# Patient Record
Sex: Female | Born: 1995 | Race: White | Hispanic: No | Marital: Single | Smoking: Never smoker
Health system: Southern US, Community
[De-identification: ages and names within clinical notes are randomized; demographics above are authoritative.]

## PROBLEM LIST (undated history)

## (undated) ENCOUNTER — Emergency Department: Payer: BLUE CROSS/BLUE SHIELD | Source: Home / Self Care

## (undated) DIAGNOSIS — J45909 Unspecified asthma, uncomplicated: Secondary | ICD-10-CM

## (undated) DIAGNOSIS — F410 Panic disorder [episodic paroxysmal anxiety] without agoraphobia: Secondary | ICD-10-CM

## (undated) DIAGNOSIS — F209 Schizophrenia, unspecified: Secondary | ICD-10-CM

## (undated) DIAGNOSIS — F419 Anxiety disorder, unspecified: Secondary | ICD-10-CM

## (undated) DIAGNOSIS — F4312 Post-traumatic stress disorder, chronic: Secondary | ICD-10-CM

## (undated) DIAGNOSIS — F609 Personality disorder, unspecified: Secondary | ICD-10-CM

## (undated) DIAGNOSIS — F32A Depression, unspecified: Secondary | ICD-10-CM

## (undated) DIAGNOSIS — E669 Obesity, unspecified: Secondary | ICD-10-CM

---

## 2014-03-15 ENCOUNTER — Emergency Department: Payer: Self-pay | Admitting: Emergency Medicine

## 2014-03-15 LAB — SALICYLATE LEVEL: Salicylates, Serum: <1.7 mg/dL

## 2014-03-15 LAB — URINALYSIS, COMPLETE
BILIRUBIN, UR: NEGATIVE
Bacteria: NONE SEEN
Blood: NEGATIVE
Glucose,UR: NEGATIVE mg/dL (ref 0–75)
Ketone: NEGATIVE
NITRITE: NEGATIVE
Ph: 6 (ref 4.5–8.0)
Protein: NEGATIVE
SPECIFIC GRAVITY: 1.025 (ref 1.003–1.030)
Squamous Epithelial: 43
WBC UR: 34 /HPF (ref 0–5)

## 2014-03-15 LAB — DRUG SCREEN, URINE
AMPHETAMINES, UR SCREEN: NEGATIVE (ref ?–1000)
Barbiturates, Ur Screen: NEGATIVE (ref ?–200)
Benzodiazepine, Ur Scrn: NEGATIVE (ref ?–200)
Cannabinoid 50 Ng, Ur ~~LOC~~: NEGATIVE (ref ?–50)
Cocaine Metabolite,Ur ~~LOC~~: NEGATIVE (ref ?–300)
MDMA (ECSTASY) UR SCREEN: NEGATIVE (ref ?–500)
Methadone, Ur Screen: NEGATIVE (ref ?–300)
OPIATE, UR SCREEN: NEGATIVE (ref ?–300)
Phencyclidine (PCP) Ur S: NEGATIVE (ref ?–25)
TRICYCLIC, UR SCREEN: NEGATIVE (ref ?–1000)

## 2014-03-15 LAB — CBC
HCT: 38.2 % (ref 35.0–47.0)
HGB: 12.4 g/dL (ref 12.0–16.0)
MCH: 27.6 pg (ref 26.0–34.0)
MCHC: 32.4 g/dL (ref 32.0–36.0)
MCV: 85 fL (ref 80–100)
Platelet: 303 10*3/uL (ref 150–440)
RBC: 4.48 10*6/uL (ref 3.80–5.20)
RDW: 15 % — ABNORMAL HIGH (ref 11.5–14.5)
WBC: 13.5 10*3/uL — ABNORMAL HIGH (ref 3.6–11.0)

## 2014-03-15 LAB — COMPREHENSIVE METABOLIC PANEL
ALK PHOS: 81 U/L
ALT: 31 U/L (ref 12–78)
ANION GAP: 7 (ref 7–16)
Albumin: 3.7 g/dL — ABNORMAL LOW (ref 3.8–5.6)
BUN: 11 mg/dL (ref 7–18)
Bilirubin,Total: 0.2 mg/dL (ref 0.2–1.0)
CREATININE: 1.1 mg/dL (ref 0.60–1.30)
Calcium, Total: 9.1 mg/dL (ref 9.0–10.7)
Chloride: 106 mmol/L (ref 98–107)
Co2: 25 mmol/L (ref 21–32)
EGFR (African American): >60
EGFR (Non-African Amer.): >60
Glucose: 102 mg/dL — ABNORMAL HIGH (ref 65–99)
OSMOLALITY: 275 (ref 275–301)
POTASSIUM: 3.9 mmol/L (ref 3.5–5.1)
SGOT(AST): 17 U/L (ref 0–26)
SODIUM: 138 mmol/L (ref 136–145)
Total Protein: 8.1 g/dL (ref 6.4–8.6)

## 2014-03-15 LAB — TSH: THYROID STIMULATING HORM: 10.4 u[IU]/mL — AB

## 2014-03-15 LAB — ETHANOL
Ethanol %: <0.003 % (ref 0.000–0.080)
Ethanol: <3 mg/dL

## 2014-03-15 LAB — ACETAMINOPHEN LEVEL: Acetaminophen: <2 ug/mL

## 2020-12-31 ENCOUNTER — Other Ambulatory Visit: Payer: Self-pay

## 2020-12-31 DIAGNOSIS — Z20822 Contact with and (suspected) exposure to covid-19: Secondary | ICD-10-CM

## 2021-01-02 LAB — NOVEL CORONAVIRUS, NAA: SARS-CoV-2, NAA: DETECTED — AB

## 2021-01-02 LAB — SARS-COV-2, NAA 2 DAY TAT

## 2021-03-16 ENCOUNTER — Emergency Department: Payer: 59

## 2021-03-16 ENCOUNTER — Encounter: Payer: Self-pay | Admitting: Emergency Medicine

## 2021-03-16 ENCOUNTER — Emergency Department
Admission: EM | Admit: 2021-03-16 | Discharge: 2021-03-16 | Disposition: A | Discharge status: Home / Self Care | Payer: 59 | Attending: Emergency Medicine | Admitting: Emergency Medicine

## 2021-03-16 ENCOUNTER — Other Ambulatory Visit: Payer: Self-pay

## 2021-03-16 DIAGNOSIS — R202 Paresthesia of skin: Secondary | ICD-10-CM | POA: Diagnosis not present

## 2021-03-16 HISTORY — DX: Obesity, unspecified: E66.9

## 2021-03-16 HISTORY — DX: Panic disorder (episodic paroxysmal anxiety): F41.0

## 2021-03-16 HISTORY — DX: Anxiety disorder, unspecified: F41.9

## 2021-03-16 NOTE — Discharge Instructions (Signed)
Your workup in the Emergency Department today was reassuring.  We did not find any specific abnormalities.  We recommend you drink plenty of fluids, take your regular medications and/or any new ones prescribed today, and follow up with the doctor(s) listed in these documents as recommended.  Return to the Emergency Department if you develop new or worsening symptoms that concern you.  

## 2021-03-16 NOTE — ED Notes (Signed)
Pt to MRI now

## 2021-03-16 NOTE — ED Triage Notes (Addendum)
Pt states around 45 min pta she had acute onset of left sided facial numbness and tongue numbness. States symptoms lasted 5 min, states no deficits at this time. No history of the same, pt very anxious in triage, does have hx of anxiety.

## 2021-03-16 NOTE — ED Provider Notes (Signed)
[facility] Emergency Department Provider Note  ____________________________________________   Event Date/Time   First MD Initiated Contact with Patient 03/16/21 0125     (approximate)  I have reviewed the triage vital signs and the nursing notes.   HISTORY  Chief Complaint Numbness    HPI Nancy Matthews is a 25 y.o. female who presents for evaluation of acute onset left-sided facial numbness that occurred just prior to arrival.  She was lying in bed watching YouTube when she felt her face go numb.  There was no pain and it was only on the left.  It lasted maybe 30 minutes before it resolved but then she got concerned because she started to Google what could have caused something like that happened and became afraid she was having a stroke.  She is asymptomatic currently.  It was acute in onset and severe but completely resolved.  Nothing particular made it better or worse.  She had no focal weakness in any of her extremities.  No word finding difficulties, no difficulty with speech or swallowing.  No difficulty with ambulation.  No confusion.  She says that she has not had any recent fever, sore throat, neck pain, neck stiffness, chest pain, shortness of breath, cough, nausea, vomiting, abdominal pain, nor dysuria.  No drug or alcohol use, no recent new medications.  She has been in her usual state of health.  Although she has a history of anxiety and panic attacks, she said there was nothing about which she was worried or anxious when the symptoms occurred.  She admits that she became more anxious after thinking that she might of had a stroke but it was not an issue ahead of time.  She currently has no primary care doctor but just recently got insurance and plans to become established with a doctor.  She has never had these issues in the past but she says she has family members who have had strokes.  No history of blood clots in the legs of the lungs.  No current  use of contraception, not for least 3 years.        Past Medical History:  Diagnosis Date  . Anxiety   . Obesity   . Panic attacks     There are no problems to display for this patient.   History reviewed. No pertinent surgical history.  Prior to Admission medications   Not on File    Allergies Patient has no allergy information on record.  History reviewed. No pertinent family history.  Social History Social History   Tobacco Use  . Smoking status: Never Smoker  . Smokeless tobacco: Never Used    Review of Systems Constitutional: No fever/chills Eyes: No visual changes. ENT: No sore throat. Cardiovascular: Denies chest pain. Respiratory: Denies shortness of breath. Gastrointestinal: No abdominal pain.  No nausea, no vomiting.  No diarrhea.  No constipation. Genitourinary: Negative for dysuria. Musculoskeletal: Negative for neck pain.  Negative for back pain. Integumentary: Negative for rash. Neurological: Acute onset left-sided facial numbness, lasting about 30 minutes, now completely resolved.  No focal weakness.   ____________________________________________   PHYSICAL EXAM:  VITAL SIGNS: ED Triage Vitals  Enc Vitals Group     BP 03/16/21 0110 107/66     Pulse Rate 03/16/21 0110 91     Resp 03/16/21 0110 20     Temp 03/16/21 0110 98.2 F (36.8 C)     Temp Source 03/16/21 0110 Oral     SpO2 03/16/21 0110  100 %     Weight 03/16/21 0111 133.8 kg (295 lb)     Height 03/16/21 0111 1.651 m (5\' 5" )     Head Circumference --      Peak Flow --      Pain Score 03/16/21 0111 0     Pain Loc --      Pain Edu? --      Excl. in GC? --     Constitutional: Alert and oriented.  Eyes: Conjunctivae are normal.  Head: Atraumatic. Nose: No congestion/rhinnorhea. Mouth/Throat: Patient is wearing a mask. Neck: No stridor.  No meningeal signs.   Cardiovascular: Normal rate, regular rhythm. Good peripheral circulation. Respiratory: Normal respiratory effort.   No retractions. Gastrointestinal: Soft and nontender. No distention.  Musculoskeletal: No lower extremity tenderness nor edema. No gross deformities of extremities. Neurologic:  Normal speech and language. No gross focal neurologic deficits are appreciated.  Patient has good strength in bilateral upper and lower extremities.  Normal finger-to-nose testing.  No cranial nerve deficits at this time including normal and equal light touch sensation on the face. Skin:  Skin is warm, dry and intact. Psychiatric: Mood and affect are normal. Speech and behavior are normal.  ____________________________________________   LABS (all labs ordered are listed, but only abnormal results are displayed)  Labs Reviewed - No data to display ____________________________________________  EKG  No indication for emergent EKG ____________________________________________  RADIOLOGY I, 05/16/21, personally viewed and evaluated these images (plain radiographs) as part of my medical decision making, as well as reviewing the written report by the radiologist.  ED MD interpretation: No acute abnormalities identified on MRI of the brain  Official radiology report(s): MR BRAIN WO CONTRAST  Result Date: 03/16/2021 CLINICAL DATA:  Initial evaluation for acute neuro deficit, stroke suspected, acute left-sided facial numbness and tongue numbness. EXAM: MRI HEAD WITHOUT CONTRAST TECHNIQUE: Multiplanar, multiecho pulse sequences of the brain and surrounding structures were obtained without intravenous contrast. COMPARISON:  None available. FINDINGS: Brain: Examination mildly limited by susceptibility artifact emanating from the right periauricular region. Cerebral volume within normal limits. No focal parenchymal signal abnormality. No significant cerebral white matter disease. No abnormal foci of restricted diffusion to suggest acute or subacute ischemia. Gray-white matter differentiation well maintained. No  encephalomalacia to suggest chronic cortical infarction or other insult. No foci of susceptibility artifact to suggest acute or chronic intracranial hemorrhage. No mass lesion, midline shift or mass effect. Ventricles normal size without hydrocephalus. Pituitary gland and suprasellar region normal. Midline structures intact and normal. Vascular: Major intracranial vascular flow voids are well maintained and normal. Skull and upper cervical spine: Craniocervical junction within normal limits. Bone marrow signal intensity normal. No focal marrow replacing lesion. No scalp soft tissue abnormality. Sinuses/Orbits: Globes and orbital soft tissues within normal limits. Paranasal sinuses are clear. No appreciable mastoid effusion. Inner ear structures grossly normal. Other: None. IMPRESSION: Normal brain MRI. No acute intracranial abnormality identified. Electronically Signed   By: 05/16/2021 M.D.   On: 03/16/2021 03:04    ____________________________________________   PROCEDURES   Procedure(s) performed (including Critical Care):  Procedures   ____________________________________________   INITIAL IMPRESSION / MDM / ASSESSMENT AND PLAN / ED COURSE  As part of my medical decision making, I reviewed the following data within the electronic MEDICAL RECORD NUMBER Nursing notes reviewed and incorporated, Old chart reviewed and Notes from prior ED visits   Differential diagnosis includes, but is not limited to, anxiety/panic attack, electrolyte abnormality, CVA/TIA, Bell's palsy, Ramsay Hunt  syndrome.  Patient has no risk factors for CVA and had a brief episode of paresthesia which completely resolved.  She had no associated pain, has no rash on her face, no ear pain, and no residual cranial nerve deficits including no facial droop or ptosis.  She has had no recent nor current symptoms of viral illness.  Vital signs are stable and within normal limits.  Neuro exam is currently intact and  reassuring.  Patient is very low risk for CVA but given lack of any other explanation I talked with her about obtaining an MRI of the brain and she agrees.  I think this is very reasonable and she understands that if it is normal, given her low risk of CVA, it would be appropriate for discharge and outpatient follow-up.  I will also check basic lab work although it is unlikely to be contributory and she agrees with the plan.  She knows to tell me if she develops any new or worsening symptoms.       Clinical Course as of 03/16/21 0325  Tue Mar 16, 2021  0315 MR BRAIN WO CONTRAST Reassuring MRI with no evidence of acute abnormality.  I talked with the patient and she has had no recurrence of symptoms.  Blood work was not collected but upon further reflection I think it is very unlikely that blood test would reveal any acute abnormalities or explanations for the patient's paresthesia.  I talked with her about this and she is very comfortable not doing any blood work at this time.  I provided her with some phone numbers to help her establish a primary care doctor and I gave my usual and customary return precautions.  She understands and agrees with the plan. [CF]    Clinical Course User Index [CF] Loleta Rose, MD     ____________________________________________  FINAL CLINICAL IMPRESSION(S) / ED DIAGNOSES  Final diagnoses:  Paresthesia     MEDICATIONS GIVEN DURING THIS VISIT:  Medications - No data to display   ED Discharge Orders    None      *Please note:  Nancy Matthews was evaluated in Emergency Department on 03/16/2021 for the symptoms described in the history of present illness. She was evaluated in the context of the global COVID-19 pandemic, which necessitated consideration that the patient might be at risk for infection with the SARS-CoV-2 virus that causes COVID-19. Institutional protocols and algorithms that pertain to the evaluation of patients at risk for COVID-19 are in a  state of rapid change based on information released by regulatory bodies including the CDC and federal and state organizations. These policies and algorithms were followed during the patient's care in the ED.  Some ED evaluations and interventions may be delayed as a result of limited staffing during and after the pandemic.*  Note:  This document was prepared using Dragon voice recognition software and may include unintentional dictation errors.   Loleta Rose, MD 03/16/21 [phone]

## 2023-01-27 IMAGING — MR MR HEAD W/O CM
11 series · 46 of 48 positions shown · non-contrast
Comparison: None available.

CLINICAL DATA: Initial evaluation for acute neuro deficit, stroke
suspected, acute left-sided facial numbness and tongue numbness.

EXAM:
MRI HEAD WITHOUT CONTRAST
TECHNIQUE: Multiplanar, multiecho pulse sequences of the brain and surrounding
structures were obtained without intravenous contrast.

[Series 5: ax dwi_tracew · axial · 3.0mm · 0.65mm/px · z∈[-85,+69]mm · 4 of 48 slices shown]
[im 1/48]
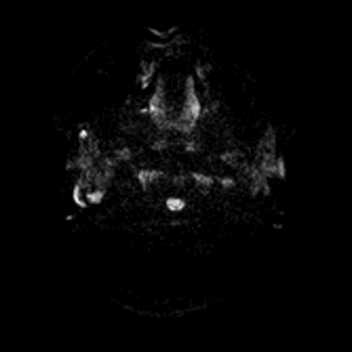
[im 16/48]
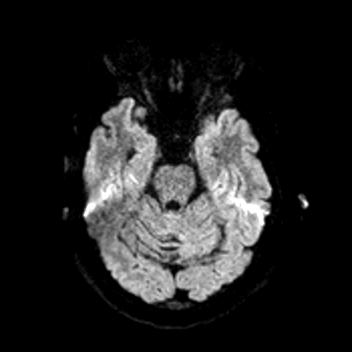
[im 32/48]
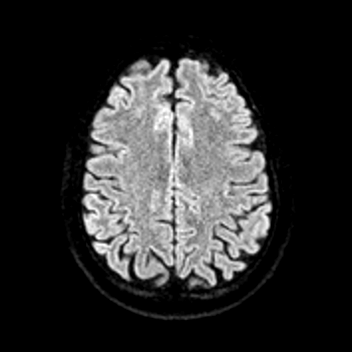
[im 48/48]
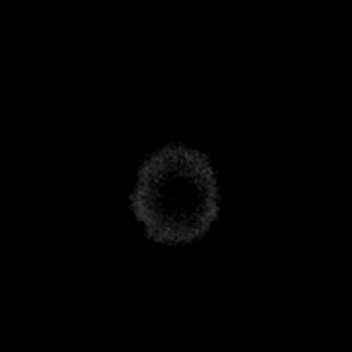

[Series 6: ax dwi_adc · axial · 3.0mm · 0.65mm/px · z∈[-85,+69]mm · 4 of 48 slices shown]
[im 1/48]
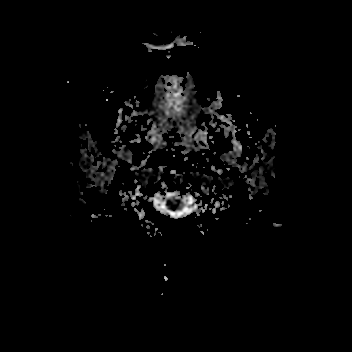
[im 16/48]
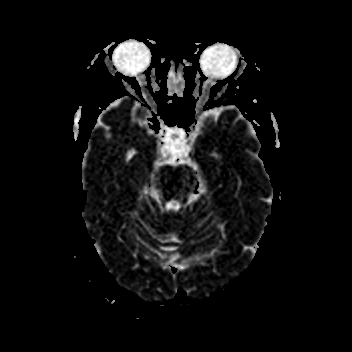
[im 32/48]
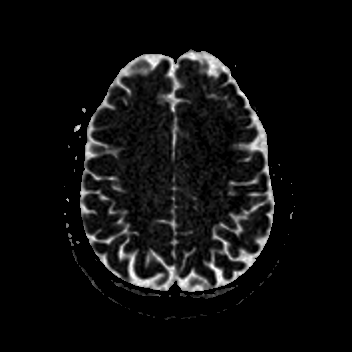
[im 48/48]
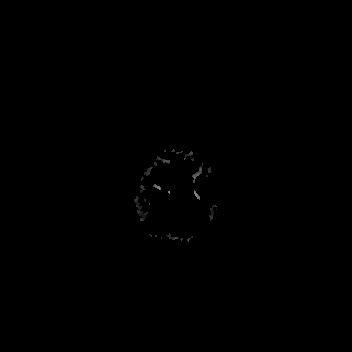

[Series 7: cor dwi_tracew · coronal · 5.0mm · 0.65mm/px · 3 of 40 slices shown]
[im 1/40]
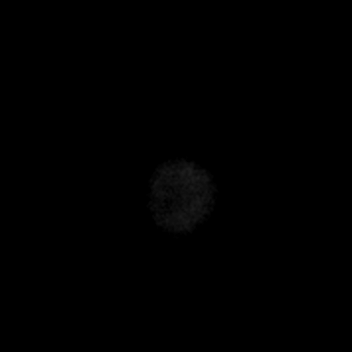
[im 20/40]
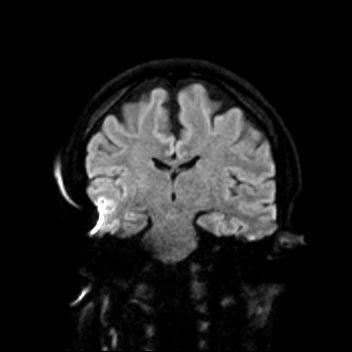
[im 40/40]
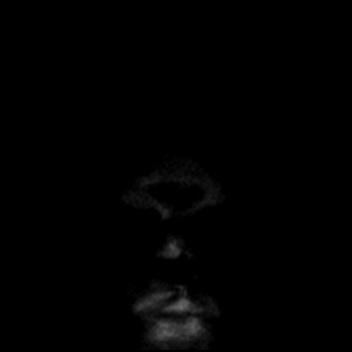

[Series 8: cor dwi_adc · coronal · 5.0mm · 0.65mm/px · 3 of 38 slices shown]
[im 1/38]
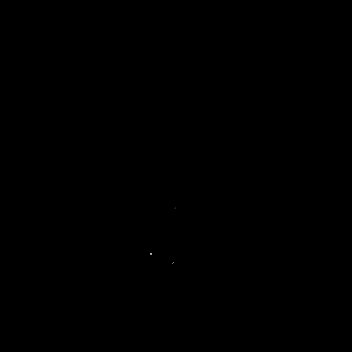
[im 19/38]
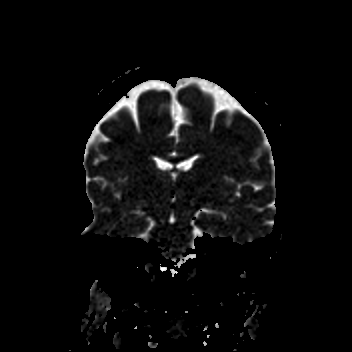
[im 38/38]
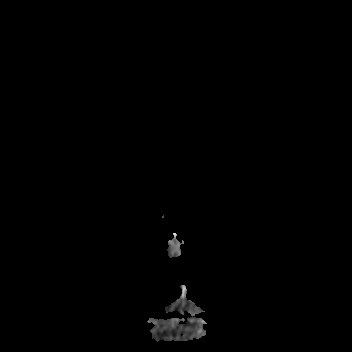

[Series 9: T1 · sagittal · 5.0mm · 0.62mm/px · 2 of 22 slices shown (1 of 2)]
[im 1/22]
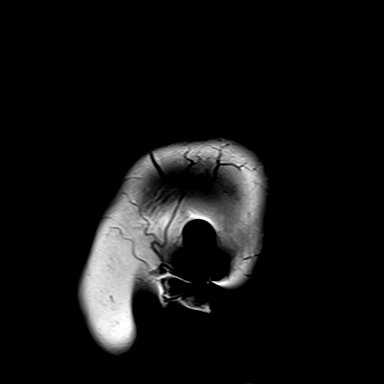
[im 22/22]
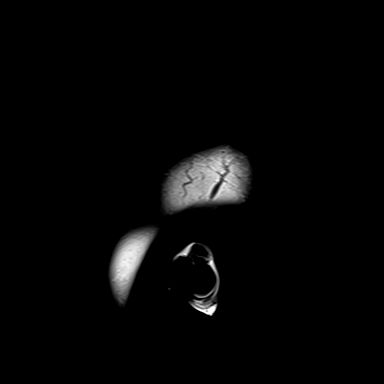

[Series 10: T2 · axial · 5.0mm · 0.53mm/px · z∈[-86,+69]mm · 2 of 27 slices shown (1 of 2)]
[im 1/27]
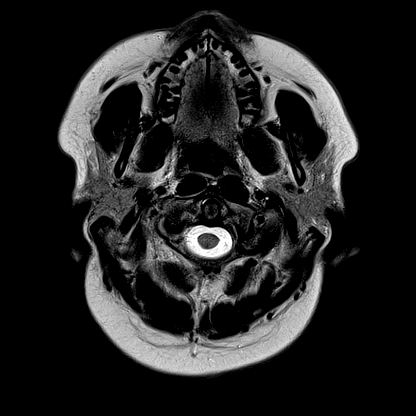
[im 27/27]
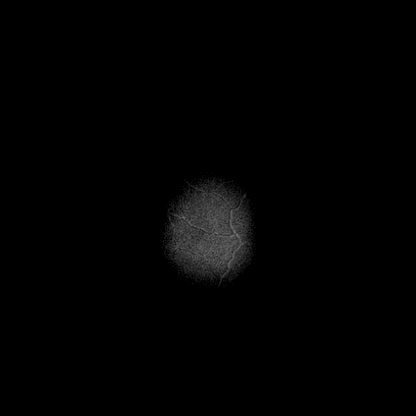

[Series 12: pha_images · axial · 3.0mm · 0.90mm/px · z∈[-96,+80]mm · 5 of 60 slices shown]
[im 1/60]
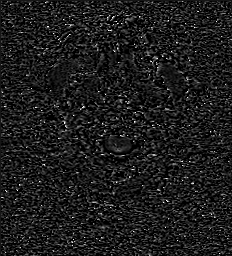
[im 15/60]
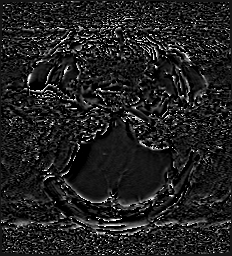
[im 30/60]
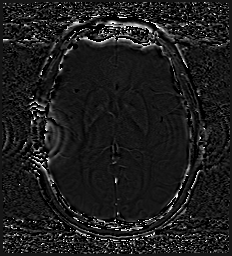
[im 45/60]
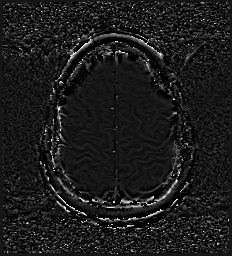
[im 60/60]
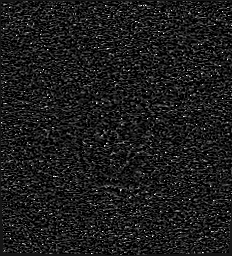

[Series 13: swi_images · axial · 3.0mm · 0.90mm/px · z∈[-96,+80]mm · 5 of 60 slices shown]
[im 1/60]
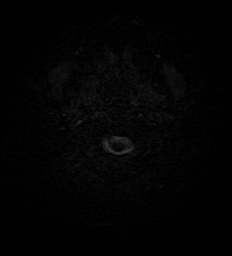
[im 15/60]
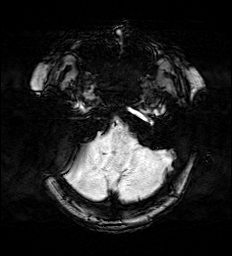
[im 30/60]
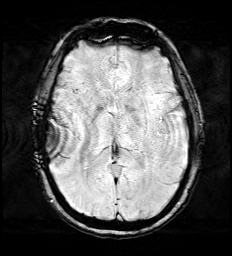
[im 45/60]
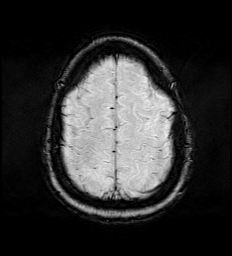
[im 60/60]
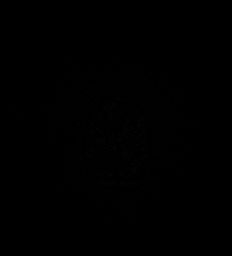

[Series 15: FLAIR · axial · 3.0mm · 0.53mm/px · z∈[-89,+72]mm · 4 of 55 slices shown]
[im 1/55]
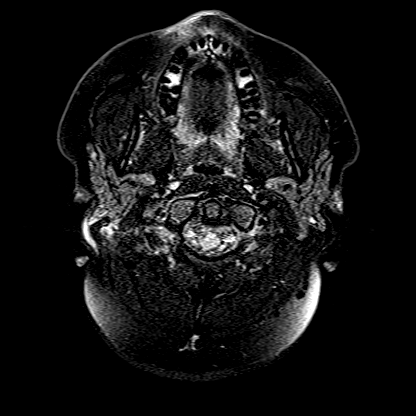
[im 19/55]
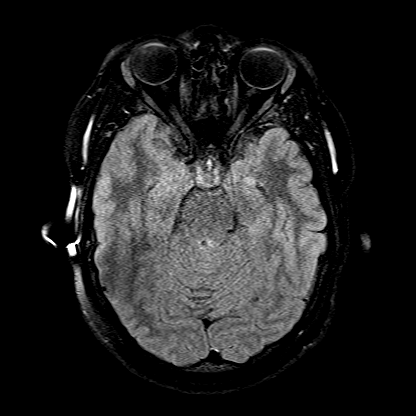
[im 37/55]
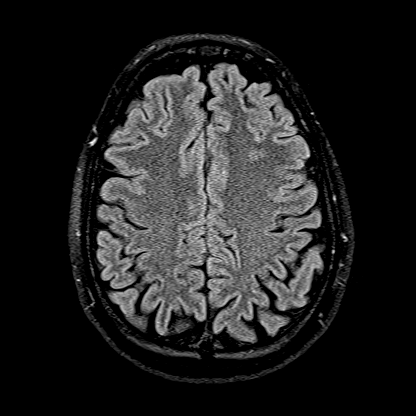
[im 55/55]
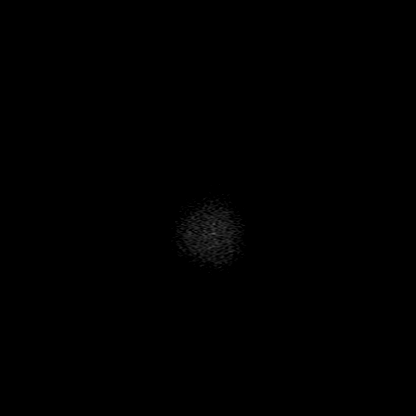

[Series 16: T1 · axial · 1.0mm · 0.98mm/px · z∈[-94,+80]mm · 12 of 176 slices shown (2 of 2)]
[im 1/176]
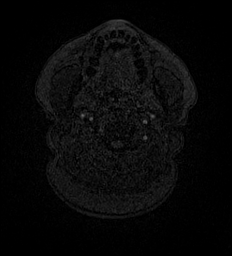
[im 14/176]
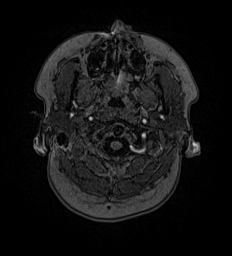
[im 27/176]
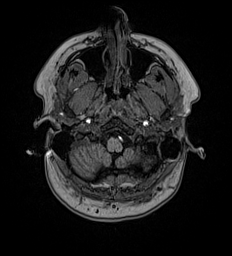
[im 41/176]
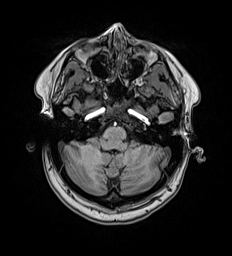
[im 54/176]
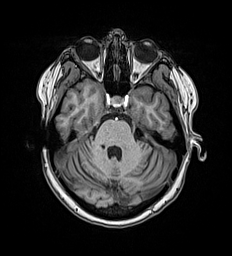
[im 68/176]
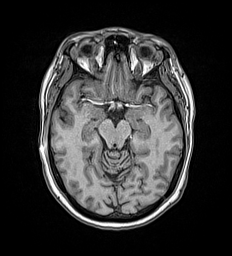
[im 81/176]
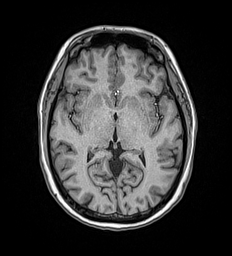
[im 95/176]
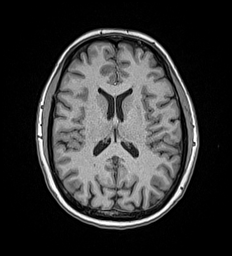
[im 108/176]
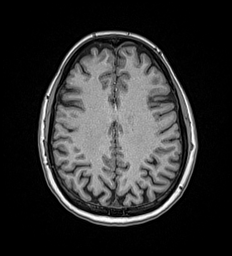
[im 122/176]
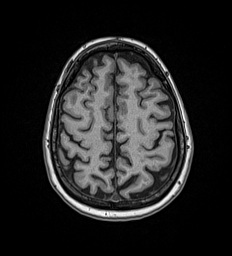
[im 149/176]
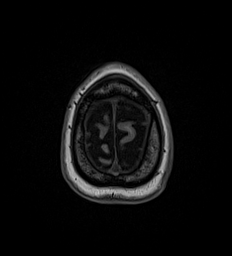
[im 176/176]
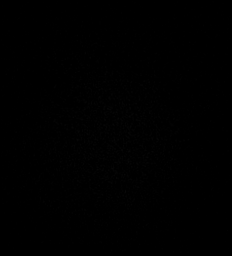

[Series 17: T2 · coronal · 5.0mm · 0.57mm/px · 2 of 29 slices shown (2 of 2)]
[im 1/29]
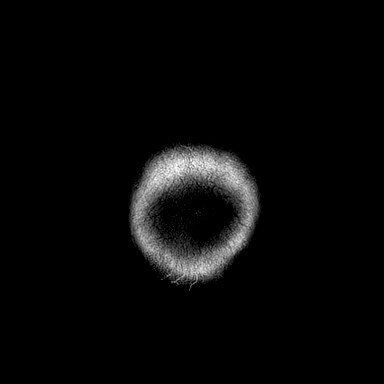
[im 29/29]
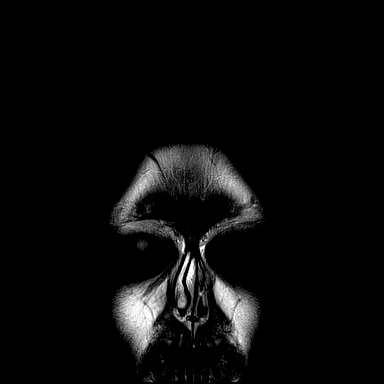

[46 of 48 positions shown; findings below may reference images not displayed]

FINDINGS: Brain: Examination mildly limited by susceptibility artifact
emanating from the right periauricular region.

Cerebral volume within normal limits. No focal parenchymal signal
abnormality. No significant cerebral white matter disease. No
abnormal foci of restricted diffusion to suggest acute or subacute
ischemia. Gray-white matter differentiation well maintained. No
encephalomalacia to suggest chronic cortical infarction or other
insult. No foci of susceptibility artifact to suggest acute or
chronic intracranial hemorrhage.

No mass lesion, midline shift or mass effect. Ventricles normal size
without hydrocephalus. Pituitary gland and suprasellar region
normal. Midline structures intact and normal.

Vascular: Major intracranial vascular flow voids are well maintained
and normal.

Skull and upper cervical spine: Craniocervical junction within
normal limits. Bone marrow signal intensity normal. No focal marrow
replacing lesion. No scalp soft tissue abnormality.

Sinuses/Orbits: Globes and orbital soft tissues within normal
limits. Paranasal sinuses are clear. No appreciable mastoid
effusion. Inner ear structures grossly normal.

Other: None.
IMPRESSION: Normal brain MRI. No acute intracranial abnormality identified.

## 2023-03-28 ENCOUNTER — Emergency Department
Admission: EM | Admit: 2023-03-28 | Discharge: 2023-03-28 | Discharge status: Against Medical Advice | Payer: BLUE CROSS/BLUE SHIELD

## 2023-05-17 ENCOUNTER — Encounter: Payer: Self-pay | Admitting: Emergency Medicine

## 2023-05-17 ENCOUNTER — Emergency Department: Payer: BLUE CROSS/BLUE SHIELD

## 2023-05-17 DIAGNOSIS — W25XXXA Contact with sharp glass, initial encounter: Secondary | ICD-10-CM | POA: Insufficient documentation

## 2023-05-17 DIAGNOSIS — S0181XA Laceration without foreign body of other part of head, initial encounter: Secondary | ICD-10-CM | POA: Insufficient documentation

## 2023-05-17 DIAGNOSIS — F250 Schizoaffective disorder, bipolar type: Secondary | ICD-10-CM | POA: Diagnosis not present

## 2023-05-17 DIAGNOSIS — T1491XA Suicide attempt, initial encounter: Secondary | ICD-10-CM | POA: Diagnosis not present

## 2023-05-17 NOTE — ED Triage Notes (Signed)
Pt reports she is schizoaffective and has BPD. Pt today got upset and hit her head on the wall several times and then took wall mirror and broke it over her forehead. Pt denies SI and HI. Pt compliant on medication and psychiatry appointments. Pt calm and cooperative in triage and sts she has episodes like this when she gets upset. Pt has swelling and abrasions noted to her forehead as well as a 2 inch laceration in the shape of an upside down "L" above her right eyebrow. No bleeding. Pt denies LOC. Pt advised that she may have a consult due to her self mutilation occurrence today. Pt provides verbal understanding and request if possible that she get a referral for a therapist because she has not been able to find one locally.

## 2023-05-18 ENCOUNTER — Other Ambulatory Visit: Payer: Self-pay

## 2023-05-18 ENCOUNTER — Emergency Department (EMERGENCY_DEPARTMENT_HOSPITAL)
Admission: EM | Admit: 2023-05-18 | Discharge: 2023-05-19 | Disposition: A | Discharge status: Other Acute Inpatient Hospital | Payer: BLUE CROSS/BLUE SHIELD | Source: Home / Self Care

## 2023-05-18 ENCOUNTER — Encounter: Payer: Self-pay | Admitting: *Deleted

## 2023-05-18 ENCOUNTER — Emergency Department
Admission: EM | Admit: 2023-05-18 | Discharge: 2023-05-18 | Disposition: A | Discharge status: Home / Self Care | Payer: BLUE CROSS/BLUE SHIELD | Source: Home / Self Care | Attending: Emergency Medicine | Admitting: Emergency Medicine

## 2023-05-18 DIAGNOSIS — S0181XA Laceration without foreign body of other part of head, initial encounter: Secondary | ICD-10-CM

## 2023-05-18 DIAGNOSIS — S61512A Laceration without foreign body of left wrist, initial encounter: Secondary | ICD-10-CM | POA: Insufficient documentation

## 2023-05-18 DIAGNOSIS — Z79899 Other long term (current) drug therapy: Secondary | ICD-10-CM | POA: Insufficient documentation

## 2023-05-18 DIAGNOSIS — T1491XA Suicide attempt, initial encounter: Secondary | ICD-10-CM | POA: Diagnosis not present

## 2023-05-18 DIAGNOSIS — X789XXA Intentional self-harm by unspecified sharp object, initial encounter: Secondary | ICD-10-CM | POA: Insufficient documentation

## 2023-05-18 DIAGNOSIS — F332 Major depressive disorder, recurrent severe without psychotic features: Secondary | ICD-10-CM | POA: Insufficient documentation

## 2023-05-18 HISTORY — DX: Unspecified asthma, uncomplicated: J45.909

## 2023-05-18 HISTORY — DX: Personality disorder, unspecified: F60.9

## 2023-05-18 HISTORY — DX: Depression, unspecified: F32.A

## 2023-05-18 HISTORY — DX: Post-traumatic stress disorder, chronic: F43.12

## 2023-05-18 HISTORY — DX: Schizophrenia, unspecified: F20.9

## 2023-05-18 LAB — ACETAMINOPHEN LEVEL: Acetaminophen (Tylenol), Serum: <10 ug/mL — ABNORMAL LOW (ref 10–30)

## 2023-05-18 LAB — CBC
HCT: 38.8 % (ref 36.0–46.0)
Hemoglobin: 12.7 g/dL (ref 12.0–15.0)
MCH: 28.3 pg (ref 26.0–34.0)
MCHC: 32.7 g/dL (ref 30.0–36.0)
MCV: 86.4 fL (ref 80.0–100.0)
Platelets: 286 10*3/uL (ref 150–400)
RBC: 4.49 MIL/uL (ref 3.87–5.11)
RDW: 13.9 % (ref 11.5–15.5)
WBC: 6.3 10*3/uL (ref 4.0–10.5)
nRBC: 0 % (ref 0.0–0.2)

## 2023-05-18 LAB — COMPREHENSIVE METABOLIC PANEL
ALT: 36 U/L (ref 0–44)
AST: 29 U/L (ref 15–41)
Albumin: 4 g/dL (ref 3.5–5.0)
Alkaline Phosphatase: 64 U/L (ref 38–126)
Anion gap: 10 (ref 5–15)
BUN: 11 mg/dL (ref 6–20)
CO2: 23 mmol/L (ref 22–32)
Calcium: 9 mg/dL (ref 8.9–10.3)
Chloride: 107 mmol/L (ref 98–111)
Creatinine, Ser: 0.92 mg/dL (ref 0.44–1.00)
GFR, Estimated: >60 mL/min (ref >60)
Glucose, Bld: 91 mg/dL (ref 70–99)
Potassium: 3.9 mmol/L (ref 3.5–5.1)
Sodium: 140 mmol/L (ref 135–145)
Total Bilirubin: 0.7 mg/dL (ref 0.3–1.2)
Total Protein: 7.5 g/dL (ref 6.5–8.1)

## 2023-05-18 LAB — URINE DRUG SCREEN, QUALITATIVE (ARMC ONLY)
Amphetamines, Ur Screen: NOT DETECTED
Barbiturates, Ur Screen: NOT DETECTED
Benzodiazepine, Ur Scrn: NOT DETECTED
Cannabinoid 50 Ng, Ur ~~LOC~~: POSITIVE — AB
Cocaine Metabolite,Ur ~~LOC~~: NOT DETECTED
MDMA (Ecstasy)Ur Screen: NOT DETECTED
Methadone Scn, Ur: NOT DETECTED
Opiate, Ur Screen: NOT DETECTED
Phencyclidine (PCP) Ur S: NOT DETECTED
Tricyclic, Ur Screen: NOT DETECTED

## 2023-05-18 LAB — ETHANOL: Alcohol, Ethyl (B): <10 mg/dL (ref <10)

## 2023-05-18 LAB — PREGNANCY, URINE: Preg Test, Ur: NEGATIVE

## 2023-05-18 LAB — SALICYLATE LEVEL: Salicylate Lvl: <7 mg/dL — ABNORMAL LOW (ref 7.0–30.0)

## 2023-05-18 MED ORDER — LIDOCAINE HCL (PF) 1 % IJ SOLN
10.0000 mL | Freq: Once | INTRAMUSCULAR | Status: AC
  Administered 2023-05-18: 10 mL
  Filled 2023-05-18: qty 10

## 2023-05-18 NOTE — ED Notes (Signed)
Patient states that her boyfriend called the police on her, and that she doesn't want or need to be here. Pt cooperative and answered all questions answered. Patient complains of pain her left arm, and has multiple bandaged lacerations on her arm. PT denies any suicidal thoughts or ideations at this time.

## 2023-05-18 NOTE — ED Notes (Signed)
INVOLUNTARY with all papers on chart/awaiting TTS/PSYCH consult ?

## 2023-05-18 NOTE — ED Notes (Signed)
Pt unable to void at this time. 

## 2023-05-18 NOTE — ED Triage Notes (Addendum)
Pt brought in via ems from home with 3 lacerations to left wrist.  Pt cut self with a razor blade. Bleeding controlled.    Pt reports SI.  Denies HI.  Pt was seen earlier today with similar sx.  Pt had forehead lac that was repaired.  Pt alert  speech clear.  Pt denies drugs or etoh use.  Pt is Voluntary

## 2023-05-18 NOTE — ED Provider Notes (Signed)
[facility] Provider Note    Event Date/Time   First MD Initiated Contact with Patient 05/18/23 [phone]     (approximate)   History   Laceration   HPI  Nancy Matthews is a 27 y.o. female who presents to the ED for evaluation of Laceration   Obese woman with history of schizoaffective disorder and bipolar presents alongside her boyfriend for evaluation of a laceration to her forehead.  She reports that she and her boyfriend, who is present, are planning a trip to [place]  next week and their TEFL teacher that he had lined up suddenly canceled causing significant emotional stress to the patient.  She reports punching the wall, grabbing the mirror and smashing her face into it causing a laceration.   On the way to the ED, they found another TEFL teacher and she reports feeling much better now.  Boyfriend acknowledges that she is back to baseline and seems fine now.  She is apologetic.  She denies any suicidal intent, homicidality or AV hallucinations, she reports that she sometimes "has episodes and this happens."  She has a psychiatrist that she saw as recently as last week who increased her dose of Abilify.  Patient reports compliance with her Abilify.  No recent illnesses.  She reports feeling better now that she is calm down and they do have a TEFL teacher and she is apologetic.  I offered her to see psychiatrist while she is here in the ER and she declines.  Boyfriend agrees and think this is reasonable that she does not see psychiatrist tonight considering she seems okay now.   Physical Exam   Triage Vital Signs: ED Triage Vitals  Enc Vitals Group     BP 05/17/23 2006 (!) 134/93     Pulse Rate 05/17/23 2006 79     Resp 05/17/23 2006 19     Temp 05/17/23 2006 98.4 F (36.9 C)     Temp Source 05/17/23 2006 Oral     SpO2 05/17/23 2006 100 %     Weight 05/17/23 2007 250 lb (113.4 kg)     Height 05/17/23 2007 5\' 5"  (1.651 m)     Head Circumference --       Peak Flow --      Pain Score 05/17/23 2007 4     Pain Loc --      Pain Edu? --      Excl. in GC? --     Most recent vital signs: Vitals:   05/17/23 2006 05/18/23 0125  BP: (!) 134/93 111/63  Pulse: 79 61  Resp: 19 16  Temp: 98.4 F (36.9 C) 98.3 F (36.8 C)  SpO2: 100% 100%    General: Awake, no distress.  Pleasant and conversational, calm, linear thoughts CV:  Good peripheral perfusion.  Resp:  Normal effort.  Abd:  No distention.  MSK:  No deformity noted.  Neuro:  No focal deficits appreciated. Other:  L-shaped laceration to the subcutaneous tissue of the right side of the forehead, hemostatic with direct pressure.  No signs of new entrapment.  About 3 cm in total length, 1.5 cm in each leg of L-shaped right angle   ED Results / Procedures / Treatments   Labs (all labs ordered are listed, but only abnormal results are displayed) Labs Reviewed  POC URINE PREG, ED    EKG   RADIOLOGY CT head interpreted by me without evidence of acute intracranial pathology  Official radiology report(s): CT Head Wo  Contrast  Result Date: 05/17/2023 CLINICAL DATA:  Facial trauma EXAM: CT HEAD WITHOUT CONTRAST TECHNIQUE: Contiguous axial images were obtained from the base of the skull through the vertex without intravenous contrast. RADIATION DOSE REDUCTION: This exam was performed according to the departmental dose-optimization program which includes automated exposure control, adjustment of the mA and/or kV according to patient size and/or use of iterative reconstruction technique. COMPARISON:  MRI head 03/16/2021 FINDINGS: Brain: No evidence of acute infarction, hemorrhage, hydrocephalus, extra-axial collection or mass lesion/mass effect. Vascular: No hyperdense vessel or unexpected calcification. Skull: Normal. Negative for fracture or focal lesion. Sinuses/Orbits: No acute finding. Other: Right frontal scalp laceration. IMPRESSION: 1. No acute intracranial pathology. 2. Right frontal  scalp laceration. Electronically Signed   By: Darliss Cheney M.D.   On: 05/17/2023 20:44    PROCEDURES and INTERVENTIONS:  .Marland KitchenLaceration Repair  Date/Time: 05/18/2023 7:46 AM  Performed by: Delton Prairie, MD Authorized by: Delton Prairie, MD   Consent:    Consent obtained:  Verbal   Consent given by:  Patient   Risks, benefits, and alternatives were discussed: yes   Anesthesia:    Anesthesia method:  Local infiltration   Local anesthetic:  Lidocaine 1% w/o epi Laceration details:    Location:  Face   Face location:  Forehead   Length (cm):  3 Exploration:    Hemostasis achieved with:  Direct pressure   Imaging obtained comment:  Ct   Imaging outcome: foreign body not noted   Treatment:    Area cleansed with:  Povidone-iodine   Amount of cleaning:  Standard   Irrigation solution:  Sterile saline Skin repair:    Repair method:  Sutures   Suture size:  4-0   Wound skin closure material used: monocryl.   Suture technique:  Simple interrupted and running locked   Number of sutures: x1 simple interrupted, x5 running locked. Approximation:    Approximation:  Close Repair type:    Repair type:  Simple Post-procedure details:    Procedure completion:  Tolerated well, no immediate complications   Medications  lidocaine (PF) (XYLOCAINE) 1 % injection 10 mL (10 mLs Infiltration Given by Other 05/18/23 1610)     IMPRESSION / MDM / ASSESSMENT AND PLAN / ED COURSE  I reviewed the triage vital signs and the nursing notes.  Differential diagnosis includes, but is not limited to, medication noncompliance, polysubstance abuse, suicidal, foreign body, skull fracture.  {Patient presents with symptoms of an acute illness or injury that is potentially life-threatening.  Patient presents with laceration to right forehead that is self-inflicted but accidental and seems.  She reports having an episode that she blames on her mental health, but feeling much better now.  She has reassuring imaging  without signs of foreign body and I repaired the laceration, as above with absorbable suture with decent approximation.  I considered emergent psychiatric evaluation of this patient, and need an IVC, but I see no continued evidence of psychiatric pathology to necessitate this.  Multiple reassessments and conversations are consistent with this.  Boyfriend is agreeable.  Discussed close return precautions  Clinical Course as of 05/18/23 0747  Thu May 18, 2023  0320 Repaired, well tolerated.  Again discussed psychiatry evaluation but she declines indicating she is comfortable going home and has been agrees. [DS]  0321 X1 simple interrupted, x5 running locked. 4-0 monocryl [DS]    Clinical Course User Index [DS] Delton Prairie, MD     FINAL CLINICAL IMPRESSION(S) / ED DIAGNOSES   Final  diagnoses:  Laceration of forehead, initial encounter     Rx / DC Orders   ED Discharge Orders     None        Note:  This document was prepared using Dragon voice recognition software and may include unintentional dictation errors.   Delton Prairie, MD 05/18/23 [phone]

## 2023-05-18 NOTE — ED Notes (Signed)
Patient give 2 warm blankets.

## 2023-05-18 NOTE — Discharge Instructions (Addendum)
Gently wash the wound with soap and water.  It is okay to shower, but do not submerge in a bath or go swimming as it is healing.  Do not vigorously scrub.   Gently pat dry.   Once dry, then apply Neosporin or bacitracin or even Vaseline ointment to the area to act as a barrier to help prevent infection.  We placed 6 stitches that should absorb on their own after a week or so

## 2023-05-18 NOTE — ED Notes (Signed)
Psych consult completed via telecart, pt was informed plan to admit. Pt became agitated with decision and covered self up with blanket and no longer talked to staff.

## 2023-05-18 NOTE — ED Notes (Signed)
Pt declined snack when asked. Pt had just finished her dinner tray from earlier. No other needs at the moment.

## 2023-05-18 NOTE — BH Assessment (Addendum)
Comprehensive Clinical Assessment (CCA) Note  05/18/2023 Nancy Matthews [id number]  Chief Complaint: Patient is a 27 year old female presenting to [facility] ED initially voluntary but has since been IVC'd. Per triage note Pt brought in via ems from home with 3 lacerations to left wrist.  Pt cut self with a razor blade. Bleeding controlled. Pt reports SI. Denies HI. Pt was seen earlier today with similar sx.  Pt had forehead lac that was repaired.  Pt alert  speech clear.  Pt denies drugs or etoh use. While patient presented to triage she threatened to leave because she did not want to give up her cell phone, ER Dr. Lenard Lance evaluated the patient and placed her under IVC due to the patients lacerations to her wrist as the patient reported that it was intentional to hurt herself. During this writer's assessment patient appears alert and oriented x4, calm and cooperative. Patient reports "I tried to off myself" and showed this writer her lacerations that have been wrapped up by the nursing staff. When asked why she tried to hurt herself she reports "friend drama." Patient reports that she lives with her boyfriend and reports that her boyfriend was the one who called EMS. Patient reports that she has a psychiatrist and takes her medication "Abilify 15mg " but has not found a therapist. Patient currently denies SI/HI/AH/VH.  Per Psyc NP Chinwendu Onuoha patient is recommended for Inpatient Chief Complaint  Patient presents with   Suicidal   Visit Diagnosis: Major Depressive Disorder, recurrent episode, severe    CCA Screening, Triage and Referral (STR)  Patient Reported Information How did you hear about [place]? Self  Referral name: No data recorded Referral phone number: No data recorded  Whom do you see for routine medical problems? No data recorded Practice/Facility Name: No data recorded Practice/Facility Phone Number: No data recorded Name of Contact: No data recorded Contact Number: No data  recorded Contact Fax Number: No data recorded Prescriber Name: No data recorded Prescriber Address (if known): No data recorded  What Is the Reason for Your Visit/Call Today? Pt brought in via ems from home with 3 lacerations to left wrist.  Pt cut self with a razor blade. Bleeding controlled.    Pt reports SI.  Denies HI.  Pt was seen earlier today with similar sx.  Pt had forehead lac that was repaired.  Pt alert  speech clear.  Pt denies drugs or etoh use.  Pt is Voluntary  How Long Has This Been Causing You Problems? > than 6 months  What Do You Feel Would Help You the Most Today? No data recorded  Have You Recently Been in Any Inpatient Treatment (Hospital/Detox/Crisis Center/28-Day Program)? No data recorded Name/Location of Program/Hospital:No data recorded How Long Were You There? No data recorded When Were You Discharged? No data recorded  Have You Ever Received Services From [facility] Before? No data recorded Who Do You See at [facility]? No data recorded  Have You Recently Had Any Thoughts About Hurting Yourself? Yes  Are You Planning to Commit Suicide/Harm Yourself At This time? No   Have you Recently Had Thoughts About Hurting Someone Karolee Ohs? No  Explanation: No data recorded  Have You Used Any Alcohol or Drugs in the Past 24 Hours? No  How Long Ago Did You Use Drugs or Alcohol? No data recorded What Did You Use and How Much? No data recorded  Do You Currently Have a Therapist/Psychiatrist? Yes  Name of Therapist/Psychiatrist: Patient reports having a psychiatrist  Have You Been Recently Discharged From Any Office Practice or Programs? No  Explanation of Discharge From Practice/Program: No data recorded    CCA Screening Triage Referral Assessment Type of Contact: Face-to-Face  Is this Initial or Reassessment? No data recorded Date Telepsych consult ordered in [facility]:  No data recorded Time Telepsych consult ordered in [facility]:  No data recorded  Patient  Reported Information Reviewed? No data recorded Patient Left Without Being Seen? No data recorded Reason for Not Completing Assessment: No data recorded  Collateral Involvement: No data recorded  Does Patient Have a Court Appointed Legal Guardian? No data recorded Name and Contact of Legal Guardian: No data recorded If Minor and Not Living with Parent(s), Who has Custody? No data recorded Is CPS involved or ever been involved? Never  Is APS involved or ever been involved? Never   Patient Determined To Be At Risk for Harm To Self or Others Based on Review of Patient Reported Information or Presenting Complaint? Yes, for Self-Harm  Method: No data recorded Availability of Means: No data recorded Intent: No data recorded Notification Required: No data recorded Additional Information for Danger to Others Potential: No data recorded Additional Comments for Danger to Others Potential: No data recorded Are There Guns or Other Weapons in Your Home? No  Types of Guns/Weapons: No data recorded Are These Weapons Safely Secured?                            No  Who Could Verify You Are Able To Have These Secured: No data recorded Do You Have any Outstanding Charges, Pending Court Dates, Parole/Probation? No data recorded Contacted To Inform of Risk of Harm To Self or Others: No data recorded  Location of Assessment: [facility] ED   Does Patient Present under Involuntary Commitment? Yes  IVC Papers Initial File Date: No data recorded  [place] of Residence: [place]   Patient Currently Receiving the Following Services: Medication Management   Determination of Need: Emergent (2 hours)   Options For Referral: No data recorded    CCA Biopsychosocial Intake/Chief Complaint:  No data recorded Current Symptoms/Problems: No data recorded  Patient Reported Schizophrenia/Schizoaffective Diagnosis in Past: No   Strengths: Patient is able to communicate her needs  Preferences: No data  recorded Abilities: No data recorded  Type of Services Patient Feels are Needed: No data recorded  Initial Clinical Notes/Concerns: No data recorded  Mental Health Symptoms Depression:   Change in energy/activity; Irritability; Worthlessness   Duration of Depressive symptoms:  Greater than two weeks   Mania:   None   Anxiety:    Irritability; Restlessness   Psychosis:   None   Duration of Psychotic symptoms: No data recorded  Trauma:   None   Obsessions:   None   Compulsions:   "Driven" to perform behaviors/acts; Poor Insight; Repeated behaviors/mental acts   Inattention:   None   Hyperactivity/Impulsivity:   None   Oppositional/Defiant Behaviors:   None   Emotional Irregularity:   Chronic feelings of emptiness; Potentially harmful impulsivity; Recurrent suicidal behaviors/gestures/threats   Other Mood/Personality Symptoms:  No data recorded   Mental Status Exam Appearance and self-care  Stature:   Average   Weight:   Overweight   Clothing:   Disheveled   Grooming:   Neglected   Cosmetic use:   None   Posture/gait:   Normal   Motor activity:   Not Remarkable   Sensorium  Attention:   Normal  Concentration:   Normal   Orientation:   X5   Recall/memory:   Normal   Affect and Mood  Affect:   Appropriate   Mood:   Other (Comment)   Relating  Eye contact:   Normal   Facial expression:   Responsive   Attitude toward examiner:   Cooperative   Thought and Language  Speech flow:  Clear and Coherent   Thought content:   Appropriate to Mood and Circumstances   Preoccupation:   None   Hallucinations:   None   Organization:  No data recorded  [employer] of Knowledge:   Fair   Intelligence:   Average   Abstraction:   Functional   Judgement:   Poor   Reality Testing:   Adequate   Insight:   Lacking   Decision Making:   Impulsive   Social Functioning  Social Maturity:    Impulsive   Social Judgement:   Heedless; Victimized   Stress  Stressors:   Relationship   Coping Ability:   Contractor Deficits:   None   Supports:   Friends/Service system     Religion: Religion/Spirituality Are You A Religious Person?: No  Leisure/Recreation: Leisure / Recreation Do You Have Hobbies?: No  Exercise/Diet: Exercise/Diet Do You Exercise?: No Have You Gained or Lost A Significant Amount of Weight in the Past Six Months?: No Do You Follow a Special Diet?: No Do You Have Any Trouble Sleeping?: No   CCA Employment/Education Employment/Work Situation: Employment / Work Situation Employment Situation: Unemployed Patient's Job has Been Impacted by Current Illness: No Has Patient ever Been in Equities trader?: No  Education: Education Is Patient Currently Attending School?: No Did You Have An Individualized Education Program (IIEP): No Did You Have Any Difficulty At [employer]?: No Patient's Education Has Been Impacted by Current Illness: No   CCA Family/Childhood History Family and Relationship History: Family history Marital status: Long term relationship Long term relationship, how long?: Unknown What types of issues is patient dealing with in the relationship?: "Friend drama" Additional relationship information: None Does patient have children?: No  Childhood History:  Childhood History Did patient suffer any verbal/emotional/physical/sexual abuse as a child?: No Did patient suffer from severe childhood neglect?: No Has patient ever been sexually abused/assaulted/raped as an adolescent or adult?: No Was the patient ever a victim of a crime or a disaster?: No Witnessed domestic violence?: No Has patient been affected by domestic violence as an adult?: No  Child/Adolescent Assessment:     CCA Substance Use Alcohol/Drug Use: Alcohol / Drug Use Pain Medications: see mar Prescriptions: see mar Over the Counter: see mar History of  alcohol / drug use?: No history of alcohol / drug abuse                         ASAM's:  Six Dimensions of Multidimensional Assessment  Dimension 1:  Acute Intoxication and/or Withdrawal Potential:      Dimension 2:  Biomedical Conditions and Complications:      Dimension 3:  Emotional, Behavioral, or Cognitive Conditions and Complications:     Dimension 4:  Readiness to Change:     Dimension 5:  Relapse, Continued use, or Continued Problem Potential:     Dimension 6:  Recovery/Living Environment:     ASAM Severity Score:    ASAM Recommended Level of Treatment:     Substance use Disorder (SUD)    Recommendations for Services/Supports/Treatments:  DSM5 Diagnoses: There are no problems to display for this patient.   Patient Centered Plan: Patient is on the following Treatment Plan(s):  Depression   Referrals to Alternative Service(s): Referred to Alternative Service(s):   Place:   Date:   Time:    Referred to Alternative Service(s):   Place:   Date:   Time:    Referred to Alternative Service(s):   Place:   Date:   Time:    Referred to Alternative Service(s):   Place:   Date:   Time:      @[facility] @  [employer], LCAS-A

## 2023-05-18 NOTE — ED Provider Notes (Signed)
-----------------------------------------   4:35 PM on 05/18/2023 ----------------------------------------- Patient was being seen in triage, refused to give up her cell phone and then attempted to leave rightly stated she was going to leave.  I have seen the patient briefly in the emergency department triage room patient has 3 approximate 3 to 4 cm lacerations to her left wrist which she admits were intentional to hurt herself.  Patient was seen yesterday for laceration to the forehead which was also self-inflicted.  I have placed the patient under an IVC order we will have psychiatry TTS evaluate.   Minna Antis, MD 05/18/23 1635

## 2023-05-18 NOTE — ED Notes (Signed)
Dr. Lenard Lance seeing pt in triage now.

## 2023-05-18 NOTE — ED Notes (Signed)
This tech obtained vital signs on pt.  

## 2023-05-18 NOTE — Consult Note (Signed)
Telepsych Consultation   Reason for Consult:  Suicide attempt Referring Physician:  Minna Antis Location of Patient: [facility] Location of Provider: Other: [facility]  Patient Identification: Nancy Matthews MRN:  [id number] Principal Diagnosis: Suicide attempt Diagnosis:  Schizoaffective disorder, Bipolar type, BPD  Total Time spent with patient: 20 minutes  Subjective:   Nancy Matthews is a 27 y.o. female patient admitted with psychiatric history of schizoaffective disorder bipolar type, panic attacks, and borderline personality disorder, who presented to the [facility] via EMS from home with 3 self-inflicted lacerations to her left wrist with a razor blade.  Patient was IVC'd by Dr. Lenard Lance. Patient also presented to [facility] yesterday, reporting she got upset and hit her head on the wall several times and then took a wall mirror and broke it over her forehead.  Forehead lacerations were repaired yesterday.  HPI:On evaluation, patient is alert, oriented x 4, and cooperative. Speech is clear,normal rate and coherent. Pt appears disheveled. Eye contact is good. Mood is depressed, affect is congruent with mood. Thought process is coherent and thought content is WDL. Pt is currently denying she is suicidal and states she wants to return home, but her self harm actions leading up to this moment states otherwise. Patient denies HI/AVH. There is no objective indication that the patient is responding to internal stimuli. No delusions elicited during this assessment.    Patient reports "I cut my left wrist because I was having an episode, psychological episode, I feel like there were bees in my body, I could not think straight, racing thoughts, pounding in my body, it happens often, not as much lately, less than 10 episodes since it started in October". Patient is noted to have a bandaged left wrist.   Patient reports she was suicidal earlier and lacerated her left wrist because "life sucks".    Patient identifies her stressors as "work-related since I lost my job in October. I've been unemployed and unable to find another job, and life in general. Generally, stress would set me off and I would go through fits where my body just jolts and I would hit myself on the face and ahead with my fits".  Patient reports "I've been diagnosed with a handful of wacky diagnoses since my 26 years, but my psychiatrist at [facility] diagnosed me with schizoaffective disorder, bipolar type and borderline personality disorder in November 2023 and I'm prescribed Abilify 15 mg PO at bedtime.  Patient reports she has bi-weekly phone calls with her outpatient psychiatrist.  Patient is not linked to an outpatient therapist.  Patient reports history of "lots of suicide attempts, last attempt between October and now, there has been a few".  Patient endorses CBD use 5 times a week.  She reports her sleep and appetite is good.  Patient lives with her boyfriend, denies the presence of guns or other weapons at home.  Patient denies a history of inpatient psychiatric admission.  Support, encouragement, and reassurance provided about ongoing stressors.  Patient is provided with opportunity for questions.  Discussed recommendation for inpatient psychiatric admission for stabilization and treatment. Patient is impulsive with poor insight and judgement, and has self harmed 2 says in a row. Patient is a danger to herself and will likely harm herself if discharge. Patient has already been IVC'd by EDP.  Discussed inpatient milieu and expectations.  Patient is not in agreement, however she is reminded  of her current IVC status.     Past Psychiatric History: Schizoaffective disorder, Bipolar type, BPD, Panic attacks.  Risk to Self:  Y Risk to Others: Y  Prior Inpatient Therapy:  N Prior Outpatient Therapy:  N  Past Medical History:  Past Medical History:  Diagnosis Date   Anxiety    Asthma    Chronic  post-traumatic stress disorder (PTSD)    Depression    Obesity    Panic attacks    Personality disorder (HCC)    Schizophrenia (HCC)    History reviewed. No pertinent surgical history. Family History: History reviewed. No pertinent family history. Family Psychiatric  History: N/A Social History: N/A Social History   Substance and Sexual Activity  Alcohol Use Not Currently     Social History   Substance and Sexual Activity  Drug Use Yes   Types: Marijuana   Comment: CBD    Social History   Socioeconomic History   Marital status: Single    Spouse name: Not on file   Number of children: Not on file   Years of education: Not on file   Highest education level: Not on file  Occupational History   Not on file  Tobacco Use   Smoking status: Never   Smokeless tobacco: Never  Vaping Use   Vaping Use: Never used  Substance and Sexual Activity   Alcohol use: Not Currently   Drug use: Yes    Types: Marijuana    Comment: CBD   Sexual activity: Yes  Other Topics Concern   Not on file  Social History Narrative   Not on file   Social Determinants of Health   Financial Resource Strain: Not on file  Food Insecurity: Not on file  Transportation Needs: Not on file  Physical Activity: Not on file  Stress: Not on file  Social Connections: Not on file   Additional Social History:    Allergies:  No Known Allergies  Labs:  Results for orders placed or performed during the hospital encounter of 05/18/23 (from the past 48 hour(s))  Comprehensive metabolic panel     Status: None   Collection Time: 05/18/23  3:55 PM  Result Value Ref Range   Sodium 140 135 - 145 mmol/L   Potassium 3.9 3.5 - 5.1 mmol/L   Chloride 107 98 - 111 mmol/L   CO2 23 22 - 32 mmol/L   Glucose, Bld 91 70 - 99 mg/dL    Comment: Glucose reference range applies only to samples taken after fasting for at least 8 hours.   BUN 11 6 - 20 mg/dL   Creatinine, Ser 8.29 0.44 - 1.00 mg/dL   Calcium 9.0 8.9 -  56.2 mg/dL   Total Protein 7.5 6.5 - 8.1 g/dL   Albumin 4.0 3.5 - 5.0 g/dL   AST 29 15 - 41 U/L   ALT 36 0 - 44 U/L   Alkaline Phosphatase 64 38 - 126 U/L   Total Bilirubin 0.7 0.3 - 1.2 mg/dL   GFR, Estimated >13 >08 mL/min    Comment: (NOTE) Calculated using the CKD-EPI Creatinine Equation (2021)    Anion gap 10 5 - 15    Comment: Performed at [facility], [address] [place], [place] [address]  Ethanol     Status: None   Collection Time: 05/18/23  3:55 PM  Result Value Ref Range   Alcohol, Ethyl (B) <10 <10 mg/dL    Comment: (NOTE) Lowest detectable limit for serum alcohol is 10 mg/dL.  For medical purposes only. Performed at [facility], [address] [place], [place] [address]  Salicylate level     Status: Abnormal   Collection Time: 05/18/23  3:55 PM  Result Value Ref Range   Salicylate Lvl <7.0 (L) 7.0 - 30.0 mg/dL    Comment: Performed at [facility], [address] [place], [place] [address]  Acetaminophen level     Status: Abnormal   Collection Time: 05/18/23  3:55 PM  Result Value Ref Range   Acetaminophen (Tylenol), Serum <10 (L) 10 - 30 ug/mL    Comment: (NOTE) Therapeutic concentrations vary significantly. A range of 10-30 ug/mL  may be an effective concentration for many patients. However, some  are best treated at concentrations outside of this range. Acetaminophen concentrations >150 ug/mL at 4 hours after ingestion  and >50 ug/mL at 12 hours after ingestion are often associated with  toxic reactions.  Performed at [facility], [address] [place], [place] [address]   cbc     Status: None   Collection Time: 05/18/23  3:55 PM  Result Value Ref Range   WBC 6.3 4.0 - 10.5 K/uL   RBC 4.49 3.87 - 5.11 MIL/uL   Hemoglobin 12.7 12.0 - 15.0 g/dL   HCT 09.8 11.9 - 14.7 %   MCV 86.4 80.0 - 100.0 fL   MCH 28.3 26.0 - 34.0 pg   MCHC 32.7 30.0 - 36.0 g/dL   RDW 82.9 56.2 - 13.0 %   Platelets 286 150 -  400 K/uL   nRBC 0.0 0.0 - 0.2 %    Comment: Performed at [facility], [address] [place], [place] [address]  Urine Drug Screen, Qualitative     Status: Abnormal   Collection Time: 05/18/23  8:05 PM  Result Value Ref Range   Tricyclic, Ur Screen NONE DETECTED NONE DETECTED   Amphetamines, Ur Screen NONE DETECTED NONE DETECTED   MDMA (Ecstasy)Ur Screen NONE DETECTED NONE DETECTED   Cocaine Metabolite,Ur [place] NONE DETECTED NONE DETECTED   Opiate, Ur Screen NONE DETECTED NONE DETECTED   Phencyclidine (PCP) Ur S NONE DETECTED NONE DETECTED   Cannabinoid 50 Ng, Ur [place] POSITIVE (A) NONE DETECTED   Barbiturates, Ur Screen NONE DETECTED NONE DETECTED   Benzodiazepine, Ur Scrn NONE DETECTED NONE DETECTED   Methadone Scn, Ur NONE DETECTED NONE DETECTED    Comment: (NOTE) Tricyclics + metabolites, urine    Cutoff 1000 ng/mL Amphetamines + metabolites, urine  Cutoff 1000 ng/mL MDMA (Ecstasy), urine              Cutoff 500 ng/mL Cocaine Metabolite, urine          Cutoff 300 ng/mL Opiate + metabolites, urine        Cutoff 300 ng/mL Phencyclidine (PCP), urine         Cutoff 25 ng/mL Cannabinoid, urine                 Cutoff 50 ng/mL Barbiturates + metabolites, urine  Cutoff 200 ng/mL Benzodiazepine, urine              Cutoff 200 ng/mL Methadone, urine                   Cutoff 300 ng/mL  The urine drug screen provides only a preliminary, unconfirmed analytical test result and should not be used for non-medical purposes. Clinical consideration and professional judgment should be applied to any positive drug screen result due to possible interfering substances. A more specific alternate chemical method must be used in order to obtain a confirmed analytical  result. Gas chromatography / mass spectrometry (GC/MS) is the preferred confirm atory method. Performed at [facility], [address] [place], [place] [address]   Pregnancy, urine     Status: None   Collection Time:  05/18/23  8:05 PM  Result Value Ref Range   Preg Test, Ur NEGATIVE NEGATIVE    Comment: Performed at [facility], [address] [place], [place] [address]    Medications:  No current facility-administered medications for this encounter.   Current Outpatient Medications  Medication Sig Dispense Refill   ARIPiprazole (ABILIFY) 15 MG tablet Take 15 mg by mouth daily.     metFORMIN (GLUCOPHAGE-XR) 500 MG 24 hr tablet Take 1,000 mg by mouth in the morning and at bedtime.     propranolol (INDERAL) 10 MG tablet Take 10 mg by mouth 2 (two) times daily as needed.      Musculoskeletal: Strength & Muscle Tone: within normal limits Gait & Station: normal, UTA Patient leans: N/A  Psychiatric Specialty Exam:  Presentation  General Appearance:  Disheveled  Eye Contact: Fair  Speech: Clear and Coherent  Speech Volume: Normal  Handedness: Right   Mood and Affect  Mood: Depressed  Affect: Congruent   Thought Process  Thought Processes: Coherent  Descriptions of Associations:Intact  Orientation:Full (Time, Place and Person)  Thought Content:WDL  History of Schizophrenia/Schizoaffective disorder:No  Duration of Psychotic Symptoms:No data recorded Hallucinations:Hallucinations: None  Ideas of Reference:None  Suicidal Thoughts:Suicidal Thoughts: Yes, Active SI Active Intent and/or Plan: With Plan; With Intent  Homicidal Thoughts:Homicidal Thoughts: No   Sensorium  Memory: Immediate Fair  Judgment: Poor  Insight: Shallow   Executive Functions  Concentration: Good  Attention Span: Good  Recall: Good  Fund of Knowledge: Good  Language: Good   Psychomotor Activity  Psychomotor Activity: Psychomotor Activity: Normal   Assets  Assets: Communication Skills; Housing   Sleep  Sleep: Sleep: Good    Physical Exam: Physical Exam Constitutional:      General: She is not in acute distress.    Appearance: She is not  diaphoretic.  HENT:     Head: Normocephalic.     Right Ear: External ear normal.     Left Ear: External ear normal.     Nose: No congestion.  Eyes:     General:        Right eye: No discharge.        Left eye: No discharge.  Pulmonary:     Effort: No respiratory distress.  Chest:     Chest wall: No tenderness.  Neurological:     Mental Status: She is alert and oriented to person, place, and time.  Psychiatric:        Attention and Perception: Attention and perception normal.        Mood and Affect: Mood is depressed.        Speech: Speech normal.        Behavior: Behavior is cooperative.        Thought Content: Thought content is not paranoid or delusional. Thought content includes suicidal ideation. Thought content does not include homicidal ideation. Thought content includes suicidal plan. Thought content does not include homicidal plan.        Cognition and Memory: Cognition and memory normal.        Judgment: Judgment is impulsive.    Review of Systems  Constitutional:  Negative for chills, diaphoresis and fever.  HENT:  Negative for congestion.   Eyes:  Negative for discharge.  Respiratory:  Negative for cough, shortness of breath and wheezing.   Cardiovascular:  Negative for chest pain and palpitations.  Gastrointestinal:  Negative for diarrhea, nausea and vomiting.  Neurological:  Negative for dizziness, seizures, loss of consciousness, weakness and headaches.  Psychiatric/Behavioral:  Positive for depression and suicidal ideas.    Blood pressure (!) 105/53, pulse 75, temperature 98.2 F (36.8 C), temperature source Oral, resp. rate 18, height 5\' 5"  (1.651 m), weight 115.7 kg, last menstrual period 05/13/2023, SpO2 98 %. Body mass index is 42.43 kg/m.  Treatment Plan Summary: Daily contact with patient to assess and evaluate symptoms and progress in treatment, Medication management, and Plan In patient psychiatric admission for stabilization and  treatment.  Disposition: Recommend psychiatric Inpatient admission when medically cleared.  This service was provided via telemedicine using a 2-way, interactive audio and video technology.  Names of all persons participating in this telemedicine service and their role in this encounter. Name: Naquita Vandevender Role: Patient  Name: Mancel Bale Role: NP  Name: Andee Poles Role: LCAS  Name: Deatra Canter Role: RN    Mancel Bale, NP 05/18/2023 11:31 PM

## 2023-05-18 NOTE — ED Notes (Addendum)
Black pants Black tank top Black sandals Hair tie Cell phone  Chain bracelet 2 nose piercings  2 cheek piercings unable to take out 2 ear piercings unable to take out

## 2023-05-18 NOTE — ED Notes (Signed)
Pt refusing to give up cell phone.  Pt states she will leave.  Pt is Voluntary.  DR. Sidney Ace aware and will come see pt in triage to IVC pt.

## 2023-05-19 ENCOUNTER — Encounter: Payer: Self-pay | Admitting: Nurse Practitioner

## 2023-05-19 ENCOUNTER — Inpatient Hospital Stay
Admission: AD | Admit: 2023-05-19 | Discharge: 2023-05-21 | DRG: 885 | Disposition: A | Discharge status: Home / Self Care | Payer: BLUE CROSS/BLUE SHIELD | Source: Intra-hospital | Attending: Psychiatry | Admitting: Psychiatry

## 2023-05-19 DIAGNOSIS — F4312 Post-traumatic stress disorder, chronic: Secondary | ICD-10-CM | POA: Diagnosis present

## 2023-05-19 DIAGNOSIS — F250 Schizoaffective disorder, bipolar type: Secondary | ICD-10-CM | POA: Diagnosis not present

## 2023-05-19 DIAGNOSIS — Z7984 Long term (current) use of oral hypoglycemic drugs: Secondary | ICD-10-CM | POA: Diagnosis not present

## 2023-05-19 DIAGNOSIS — W25XXXA Contact with sharp glass, initial encounter: Secondary | ICD-10-CM | POA: Diagnosis present

## 2023-05-19 DIAGNOSIS — E669 Obesity, unspecified: Secondary | ICD-10-CM | POA: Diagnosis present

## 2023-05-19 DIAGNOSIS — Z560 Unemployment, unspecified: Secondary | ICD-10-CM

## 2023-05-19 DIAGNOSIS — F332 Major depressive disorder, recurrent severe without psychotic features: Secondary | ICD-10-CM | POA: Diagnosis present

## 2023-05-19 DIAGNOSIS — S61512A Laceration without foreign body of left wrist, initial encounter: Secondary | ICD-10-CM | POA: Diagnosis present

## 2023-05-19 DIAGNOSIS — F4010 Social phobia, unspecified: Secondary | ICD-10-CM | POA: Diagnosis present

## 2023-05-19 DIAGNOSIS — F603 Borderline personality disorder: Secondary | ICD-10-CM | POA: Diagnosis present

## 2023-05-19 DIAGNOSIS — Z79899 Other long term (current) drug therapy: Secondary | ICD-10-CM

## 2023-05-19 DIAGNOSIS — X789XXA Intentional self-harm by unspecified sharp object, initial encounter: Secondary | ICD-10-CM | POA: Insufficient documentation

## 2023-05-19 DIAGNOSIS — S0181XA Laceration without foreign body of other part of head, initial encounter: Secondary | ICD-10-CM | POA: Diagnosis present

## 2023-05-19 MED ORDER — ALUM & MAG HYDROXIDE-SIMETH 200-200-20 MG/5ML PO SUSP: 30.0000 mL | ORAL | Status: DC | PRN

## 2023-05-19 MED ORDER — LORAZEPAM 2 MG/ML IJ SOLN: 2.0000 mg | Freq: Three times a day (TID) | INTRAMUSCULAR | Status: DC | PRN

## 2023-05-19 MED ORDER — ACETAMINOPHEN 325 MG PO TABS: 650.0000 mg | ORAL_TABLET | Freq: Four times a day (QID) | ORAL | Status: DC | PRN

## 2023-05-19 MED ORDER — MAGNESIUM HYDROXIDE 400 MG/5ML PO SUSP: 30.0000 mL | Freq: Every day | ORAL | Status: DC | PRN

## 2023-05-19 MED ORDER — ARIPIPRAZOLE 5 MG PO TABS
15.0000 mg | ORAL_TABLET | Freq: Every day | ORAL | Status: DC
  Administered 2023-05-19 – 2023-05-20 (×2): 15 mg via ORAL
  Filled 2023-05-19 (×2): qty 1

## 2023-05-19 MED ORDER — HALOPERIDOL LACTATE 5 MG/ML IJ SOLN: 5.0000 mg | Freq: Three times a day (TID) | INTRAMUSCULAR | Status: DC | PRN

## 2023-05-19 MED ORDER — LORAZEPAM 2 MG PO TABS: 2.0000 mg | ORAL_TABLET | Freq: Three times a day (TID) | ORAL | Status: DC | PRN

## 2023-05-19 MED ORDER — DIPHENHYDRAMINE HCL 50 MG/ML IJ SOLN: 50.0000 mg | Freq: Three times a day (TID) | INTRAMUSCULAR | Status: DC | PRN

## 2023-05-19 MED ORDER — DIPHENHYDRAMINE HCL 25 MG PO CAPS: 50.0000 mg | ORAL_CAPSULE | Freq: Three times a day (TID) | ORAL | Status: DC | PRN

## 2023-05-19 MED ORDER — TRAZODONE HCL 50 MG PO TABS
50.0000 mg | ORAL_TABLET | Freq: Every evening | ORAL | Status: DC | PRN
  Administered 2023-05-19 – 2023-05-20 (×2): 50 mg via ORAL
  Filled 2023-05-19 (×2): qty 1

## 2023-05-19 MED ORDER — HALOPERIDOL 5 MG PO TABS: 5.0000 mg | ORAL_TABLET | Freq: Three times a day (TID) | ORAL | Status: DC | PRN

## 2023-05-19 MED ORDER — HYDROXYZINE HCL 25 MG PO TABS: 25.0000 mg | ORAL_TABLET | Freq: Three times a day (TID) | ORAL | Status: DC | PRN

## 2023-05-19 NOTE — [facility] Suicide Risk Assessment (Signed)
[facility] Admission Suicide Risk Assessment   Nursing information obtained from:    Demographic factors:  Caucasian, Low socioeconomic status Current Mental Status:  Self-harm behaviors Loss Factors:  NA Historical Factors:  Impulsivity Risk Reduction Factors:  Sense of responsibility to family, Living with another person, especially a relative, Positive social support, Positive therapeutic relationship  Total Time spent with patient: 45 minutes Principal Problem: Schizoaffective disorder, bipolar type (HCC) Diagnosis:  Principal Problem:   Schizoaffective disorder, bipolar type (HCC) Active Problems:   Self-inflicted laceration of left wrist (HCC)   Borderline personality disorder (HCC)  Subjective Data: Patient seen and chart reviewed.  27 year old woman came to the emergency room brought by her friend after she cut herself on the left forearm.  Patient says at the time she was having suicidal thoughts.  The cuts are deeper than many that we see but still superficial.  Patient says that once her boyfriend stopped her she was cooperative with coming to the hospital.  Patient today denies suicidal ideation.  Denies current psychotic symptoms.  Describes herself as having chronic mood lability and that yesterday she was feeling overwhelmed emotionally.  Continued Clinical Symptoms:  Alcohol Use Disorder Identification Test Final Score (AUDIT): 2 The "Alcohol Use Disorders Identification Test", Guidelines for Use in Primary Care, Second Edition.  World Science writer [facility]). Score between 0-7:  no or low risk or alcohol related problems. Score between 8-15:  moderate risk of alcohol related problems. Score between 16-19:  high risk of alcohol related problems. Score 20 or above:  warrants further diagnostic evaluation for alcohol dependence and treatment.   CLINICAL FACTORS:   Severe Anxiety and/or Agitation Bipolar Disorder:   Mixed State   Musculoskeletal: Strength & Muscle Tone:  within normal limits Gait & Station: normal Patient leans: N/A  Psychiatric Specialty Exam:  Presentation  General Appearance:  Disheveled  Eye Contact: Fair  Speech: Clear and Coherent  Speech Volume: Normal  Handedness: Right   Mood and Affect  Mood: Depressed  Affect: Congruent   Thought Process  Thought Processes: Coherent  Descriptions of Associations:Intact  Orientation:Full (Time, Place and Person)  Thought Content:WDL  History of Schizophrenia/Schizoaffective disorder:No  Duration of Psychotic Symptoms:No data recorded Hallucinations:Hallucinations: None  Ideas of Reference:None  Suicidal Thoughts:Suicidal Thoughts: Yes, Active SI Active Intent and/or Plan: With Plan; With Intent  Homicidal Thoughts:Homicidal Thoughts: No   Sensorium  Memory: Immediate Fair  Judgment: Poor  Insight: Shallow   Executive Functions  Concentration: Good  Attention Span: Good  Recall: Good  Fund of Knowledge: Good  Language: Good   Psychomotor Activity  Psychomotor Activity: Psychomotor Activity: Normal   Assets  Assets: Communication Skills; Housing   Sleep  Sleep: Sleep: Good    Physical Exam: Physical Exam Vitals and nursing note reviewed.  Constitutional:      Appearance: Normal appearance.  HENT:     Head: Normocephalic and atraumatic.     Mouth/Throat:     Pharynx: Oropharynx is clear.  Eyes:     Pupils: Pupils are equal, round, and reactive to light.  Cardiovascular:     Rate and Rhythm: Normal rate and regular rhythm.  Pulmonary:     Effort: Pulmonary effort is normal.     Breath sounds: Normal breath sounds.  Abdominal:     General: Abdomen is flat.     Palpations: Abdomen is soft.  Musculoskeletal:        General: Normal range of motion.  Skin:    General: Skin is warm  and dry.     Comments: Several transverse lacerations on the inner aspect of the left forearm.  No sutures.  Not bleeding.   Neurological:     General: No focal deficit present.     Mental Status: She is alert. Mental status is at baseline.  Psychiatric:        Attention and Perception: Attention normal.        Mood and Affect: Mood normal.        Speech: Speech normal.        Behavior: Behavior is cooperative.        Thought Content: Thought content normal.        Cognition and Memory: Cognition normal.        Judgment: Judgment is impulsive.    Review of Systems  Constitutional: Negative.   HENT: Negative.    Eyes: Negative.   Respiratory: Negative.    Cardiovascular: Negative.   Gastrointestinal: Negative.   Musculoskeletal: Negative.   Skin: Negative.   Neurological: Negative.   Psychiatric/Behavioral:  Positive for depression. Negative for suicidal ideas. The patient is nervous/anxious.    Blood pressure 125/65, pulse 85, temperature 98.3 F (36.8 C), temperature source Oral, resp. rate 18, height 5\' 5"  (1.651 m), weight 116 kg, last menstrual period 05/13/2023, SpO2 99 %. Body mass index is 42.56 kg/m.   COGNITIVE FEATURES THAT CONTRIBUTE TO RISK:  None    SUICIDE RISK:   Mild:  Suicidal ideation of limited frequency, intensity, duration, and specificity.  There are no identifiable plans, no associated intent, mild dysphoria and related symptoms, good self-control (both objective and subjective assessment), few other risk factors, and identifiable protective factors, including available and accessible social support.  PLAN OF CARE: Patient seen and chart reviewed.  Patient is now denying any suicidal ideation.  She has not shown any behavior problems today and has been showing good insight and cooperation with treatment.  She is strongly requesting discharge.  She has outpatient treatment and plan.  Ongoing assessment of dangerousness prior to discharge possibly as early as this afternoon  I certify that inpatient services furnished can reasonably be expected to improve the patient's  condition.   Mordecai Rasmussen, MD 05/19/2023, 3:09 PM

## 2023-05-19 NOTE — Plan of Care (Signed)
Patient stated that her mood is better today but she still have social anxiety. Patient visible in the milieu. Appropriate with staff & peers.Denies SI,HI and AVH. Dressing changed on the left wrist wound.Wound looks red with no discharge. Appetite and energy level good.Support and encouragement given.

## 2023-05-19 NOTE — Tx Team (Signed)
Initial Treatment Plan 05/19/2023 6:33 AM Nancy Matthews EAV:[id number]    PATIENT STRESSORS: Other: lack of coping skills     PATIENT STRENGTHS: Capable of independent living  Supportive family/friends    PATIENT IDENTIFIED PROBLEMS: Lack of coping skills  Self injurious behavior                   DISCHARGE CRITERIA:  Reduction of life-threatening or endangering symptoms to within safe limits Verbal commitment to aftercare and medication compliance  PRELIMINARY DISCHARGE PLAN: Return to previous living arrangement  PATIENT/FAMILY INVOLVEMENT: This treatment plan has been presented to and reviewed with the patient, Nancy Matthews, and/or family member, .  The patient and family have been given the opportunity to ask questions and make suggestions.  Elbert Ewings, RN 05/19/2023, 6:33 AM

## 2023-05-19 NOTE — Group Note (Signed)
Recreation Therapy Group Note   Group Topic:Leisure Education  Group Date: 05/19/2023 Start Time: 1000 End Time: 1100 Facilitators: Rosina Lowenstein, LRT, CTRS Location:  Dayroom  Group Description: Leisure. Patients were given the option to choose from coloring mandalas, singing karaoke, playing cards, or making origami. LRT and pts discussed the meaning of leisure, the importance of participating in leisure during their free time/when they're outside of the hospital, as well as how our leisure interests can also serve as coping skills. Pt identified two leisure interests and shared with the group.   Goal Area(s) Addressed:  Patient will identify a current leisure interest.  Patient will learn the definition of "leisure". Patient will practice making a positive decision. Patient will have the opportunity to try a new leisure activity. Patient will communicate with peers and LRT.   Affect/Mood: Appropriate   Participation Level: Active and Engaged   Participation Quality: Independent   Behavior: Appropriate, Calm, and Cooperative   Speech/Thought Process: Coherent   Insight: Good   Judgement: Good   Modes of Intervention: Activity   Patient Response to Interventions:  Attentive, Engaged, Interested , and Receptive   Education Outcome:  Acknowledges education   Clinical Observations/Individualized Feedback: Loistine was active in their participation of session activities and group discussion. Pt identified "I like to play video games and crafting. I do all sorts of things and want to open up my own shop at the craft festival." Pt chose to color mandalas with oil pastels and markers. Pt interacted well with LRT and peers duration of session.   Plan: Continue to engage patient in RT group sessions 2-3x/week.   Rosina Lowenstein, LRT, CTRS 05/19/2023 11:16 AM

## 2023-05-19 NOTE — Plan of Care (Signed)
Patient newly admitted to inpatient unit and she requires stabilization of psychiatric symptoms.

## 2023-05-19 NOTE — Progress Notes (Signed)
Patient admitted under IVC for SIB, she recently cut her left wrist and a few days before she hit her head up against the wall requiring stitches. She is OX5 ,she denies SI, HI & AVH. She reports she just has these mood swings sometimes that make her do things. Patient was cooperative with admission, searched by two RN's no contraband found.

## 2023-05-19 NOTE — BH IP Treatment Plan (Signed)
Interdisciplinary Treatment and Diagnostic Plan Update  05/19/2023 Time of Session: 8:48AM  Nancy Matthews MRN: [id number]  Principal Diagnosis: MDD (major depressive disorder), recurrent severe, without psychosis (HCC)  Secondary Diagnoses: Principal Problem:   MDD (major depressive disorder), recurrent severe, without psychosis (HCC)   Current Medications:  Current Facility-Administered Medications  Medication Dose Route Frequency Provider Last Rate Last Admin   acetaminophen (TYLENOL) tablet 650 mg  650 mg Oral Q6H PRN Onuoha, Chinwendu V, NP       alum & mag hydroxide-simeth (MAALOX/MYLANTA) 200-200-20 MG/5ML suspension 30 mL  30 mL Oral Q4H PRN Onuoha, Chinwendu V, NP       diphenhydrAMINE (BENADRYL) capsule 50 mg  50 mg Oral TID PRN Onuoha, Chinwendu V, NP       Or   diphenhydrAMINE (BENADRYL) injection 50 mg  50 mg Intramuscular TID PRN Onuoha, Chinwendu V, NP       haloperidol (HALDOL) tablet 5 mg  5 mg Oral TID PRN Onuoha, Chinwendu V, NP       Or   haloperidol lactate (HALDOL) injection 5 mg  5 mg Intramuscular TID PRN Onuoha, Chinwendu V, NP       hydrOXYzine (ATARAX) tablet 25 mg  25 mg Oral TID PRN Onuoha, Chinwendu V, NP       LORazepam (ATIVAN) tablet 2 mg  2 mg Oral TID PRN Onuoha, Chinwendu V, NP       Or   LORazepam (ATIVAN) injection 2 mg  2 mg Intramuscular TID PRN Onuoha, Chinwendu V, NP       magnesium hydroxide (MILK OF MAGNESIA) suspension 30 mL  30 mL Oral Daily PRN Onuoha, Chinwendu V, NP       traZODone (DESYREL) tablet 50 mg  50 mg Oral QHS PRN Onuoha, Chinwendu V, NP       PTA Medications: Medications Prior to Admission  Medication Sig Dispense Refill Last Dose   ARIPiprazole (ABILIFY) 15 MG tablet Take 15 mg by mouth daily.      metFORMIN (GLUCOPHAGE-XR) 500 MG 24 hr tablet Take 1,000 mg by mouth in the morning and at bedtime.      propranolol (INDERAL) 10 MG tablet Take 10 mg by mouth 2 (two) times daily as needed.       Patient Stressors: Other: lack  of coping skills    Patient Strengths: Capable of independent living  Supportive family/friends   Treatment Modalities: Medication Management, Group therapy, Case management,  1 to 1 session with clinician, Psychoeducation, Recreational therapy.   Physician Treatment Plan for Primary Diagnosis: MDD (major depressive disorder), recurrent severe, without psychosis (HCC) Long Term Goal(s):     Short Term Goals:    Medication Management: Evaluate patient's response, side effects, and tolerance of medication regimen.  Therapeutic Interventions: 1 to 1 sessions, Unit Group sessions and Medication administration.  Evaluation of Outcomes: Progressing  Physician Treatment Plan for Secondary Diagnosis: Principal Problem:   MDD (major depressive disorder), recurrent severe, without psychosis (HCC)  Long Term Goal(s):     Short Term Goals:       Medication Management: Evaluate patient's response, side effects, and tolerance of medication regimen.  Therapeutic Interventions: 1 to 1 sessions, Unit Group sessions and Medication administration.  Evaluation of Outcomes: Progressing   RN Treatment Plan for Primary Diagnosis: MDD (major depressive disorder), recurrent severe, without psychosis (HCC) Long Term Goal(s): Knowledge of disease and therapeutic regimen to maintain health will improve  Short Term Goals: Ability to demonstrate self-control, Ability to participate in  decision making will improve, Ability to verbalize feelings will improve, Ability to disclose and discuss suicidal ideas, Ability to identify and develop effective coping behaviors will improve, and Compliance with prescribed medications will improve  Medication Management: RN will administer medications as ordered by provider, will assess and evaluate patient's response and provide education to patient for prescribed medication. RN will report any adverse and/or side effects to prescribing provider.  Therapeutic  Interventions: 1 on 1 counseling sessions, Psychoeducation, Medication administration, Evaluate responses to treatment, Monitor vital signs and CBGs as ordered, Perform/monitor CIWA, COWS, AIMS and Fall Risk screenings as ordered, Perform wound care treatments as ordered.  Evaluation of Outcomes: Progressing   LCSW Treatment Plan for Primary Diagnosis: MDD (major depressive disorder), recurrent severe, without psychosis (HCC) Long Term Goal(s): Safe transition to appropriate next level of care at discharge, Engage patient in therapeutic group addressing interpersonal concerns.  Short Term Goals: Engage patient in aftercare planning with referrals and resources, Increase social support, Increase ability to appropriately verbalize feelings, Increase emotional regulation, Facilitate acceptance of mental health diagnosis and concerns, Facilitate patient progression through stages of change regarding substance use diagnoses and concerns, Identify triggers associated with mental health/substance abuse issues, and Increase skills for wellness and recovery  Therapeutic Interventions: Assess for all discharge needs, 1 to 1 time with Social worker, Explore available resources and support systems, Assess for adequacy in community support network, Educate family and significant other(s) on suicide prevention, Complete Psychosocial Assessment, Interpersonal group therapy.  Evaluation of Outcomes: Progressing   Progress in Treatment: Attending groups: No. Participating in groups: No. Taking medication as prescribed: Yes. Toleration medication: Yes. Family/Significant other contact made: No, will contact:  once permission is given. Patient understands diagnosis: Yes. Discussing patient identified problems/goals with staff: Yes. Medical problems stabilized or resolved: Yes. Denies suicidal/homicidal ideation: Yes. Issues/concerns per patient self-inventory: No. Other: none  New problem(s) identified: No,  Describe:  none  New Short Term/Long Term Goal(s): detox, elimination of symptoms of psychosis, medication management for mood stabilization; elimination of SI thoughts; development of comprehensive mental wellness/sobriety plan.   Patient Goals:  "whatever I need to work on to get out, how to be a functioning person"  Discharge Plan or Barriers: CSW to assist patient in development of appropriate discharge plans.   Reason for Continuation of Hospitalization: Anxiety Depression Medication stabilization Suicidal ideation  Estimated Length of Stay:  1-7 days  Last 3 [place] Suicide Severity Risk Score: Flowsheet Row Admission (Current) from 05/19/2023 in [facility] INPATIENT BE[facility]AVIORAL MEDICINE Most recent reading at 05/19/2023  6:29 AM ED from 05/18/2023 in [facility] Emergency Department at [facility] Most recent reading at 05/18/2023  8:38 PM ED from 05/18/2023 in [facility] Emergency Department at [facility] Most recent reading at 05/17/2023  8:14 PM  C-SSRS RISK CATEGORY No Risk High Risk No Risk       Last PHQ 2/9 Scores:     No data to display          Scribe for Treatment Team: Harden Mo, LCSW 05/19/2023 10:32 AM
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
## 2023-05-19 NOTE — Group Note (Signed)
[facility] LCSW Group Therapy Note   Group Date: 05/19/2023 Start Time: 1320 End Time: 1410   Type of Therapy/Topic:  Group Therapy:  Balance in Life  Participation Level:  Active   Description of Group:    This group will address the concept of balance and how it feels and looks when one is unbalanced. Patients will be encouraged to process areas in their lives that are out of balance, and identify reasons for remaining unbalanced. Facilitators will guide patients utilizing problem- solving interventions to address and correct the stressor making their life unbalanced. Understanding and applying boundaries will be explored and addressed for obtaining  and maintaining a balanced life. Patients will be encouraged to explore ways to assertively make their unbalanced needs known to significant others in their lives, using other group members and facilitator for support and feedback.  Therapeutic Goals: Patient will identify two or more emotions or situations they have that consume much of in their lives. Patient will identify signs/triggers that life has become out of balance:  Patient will identify two ways to set boundaries in order to achieve balance in their lives:  Patient will demonstrate ability to communicate their needs through discussion and/or role plays  Summary of Patient Progress: Patient was present for the entirety of the group session. She shared that she lives by her schedule and anything that is scheduled to happen during a given day is typically all that happens that day. Pt stated that things not going the way that she feels they should or people within her life who disagree with/cause confrontation can knock her off balance. She shared a time when she got sick from Covid and had to be out of work for a span of time. Pt explained that she heard from an coworker that the manager was complaining about her because of all that work that was left undone, without any thought of what she had  done or her plans for the future work. Pt stated that she shut down for two weeks after this and eventually lost her job because she just could not get out of the bed. She was involved in reading the "7 steps to finding balance in life." Pt presented with some insight into the topic. She appeared open and receptive to feedback from both peer and facilitator.    Therapeutic Modalities:   Cognitive Behavioral Therapy Solution-Focused Therapy Assertiveness Training   Glenis Smoker, LCSW

## 2023-05-20 MED ORDER — PAROXETINE HCL 10 MG PO TABS
10.0000 mg | ORAL_TABLET | Freq: Every day | ORAL | Status: DC
  Administered 2023-05-20 – 2023-05-21 (×2): 10 mg via ORAL
  Filled 2023-05-20 (×2): qty 1

## 2023-05-20 MED ORDER — METFORMIN HCL ER 750 MG PO TB24
750.0000 mg | ORAL_TABLET | Freq: Two times a day (BID) | ORAL | Status: DC
  Administered 2023-05-20 – 2023-05-21 (×2): 750 mg via ORAL
  Filled 2023-05-20 (×3): qty 1

## 2023-05-20 NOTE — Group Note (Signed)
Date:  05/20/2023 Time:  3:49 PM  Group Topic/Focus:  Outdoor Recreation/Activity    Participation Level:  Active  Participation Quality:  Appropriate  Affect:  Appropriate  Cognitive:  Appropriate  Insight: Appropriate  Engagement in Group:  Engaged  Modes of Intervention:  Activity  Additional Comments:    Wilford Corner 05/20/2023, 3:49 PM

## 2023-05-20 NOTE — Plan of Care (Signed)
  Problem: Education: Goal: Knowledge of General Education information will improve Description: Including pain rating scale, medication(s)/side effects and non-pharmacologic comfort measures Outcome: Progressing   Problem: Health Behavior/Discharge Planning: Goal: Ability to manage health-related needs will improve Outcome: Progressing   Problem: Clinical Measurements: Goal: Ability to maintain clinical measurements within normal limits will improve Outcome: Progressing Goal: Will remain free from infection Outcome: Progressing Goal: Diagnostic test results will improve Outcome: Progressing Goal: Respiratory complications will improve Outcome: Progressing Goal: Cardiovascular complication will be avoided Outcome: Progressing   Problem: Activity: Goal: Risk for activity intolerance will decrease Outcome: Progressing   Problem: Nutrition: Goal: Adequate nutrition will be maintained Outcome: Progressing   Problem: Coping: Goal: Level of anxiety will decrease Outcome: Progressing   Problem: Elimination: Goal: Will not experience complications related to bowel motility Outcome: Progressing Goal: Will not experience complications related to urinary retention Outcome: Progressing   Problem: Pain Managment: Goal: General experience of comfort will improve Outcome: Progressing   Problem: Safety: Goal: Ability to remain free from injury will improve Outcome: Progressing   Problem: Skin Integrity: Goal: Risk for impaired skin integrity will decrease Outcome: Progressing   Problem: Activity: Goal: Will identify at least one activity in which they can participate Outcome: Progressing   Problem: Coping: Goal: Ability to identify and develop effective coping behavior will improve Outcome: Progressing Goal: Ability to interact with others will improve Outcome: Progressing Goal: Demonstration of participation in decision-making regarding own care will improve Outcome:  Progressing Goal: Ability to use eye contact when communicating with others will improve Outcome: Progressing   Problem: Health Behavior/Discharge Planning: Goal: Identification of resources available to assist in meeting health care needs will improve Outcome: Progressing   Problem: Self-Concept: Goal: Will verbalize positive feelings about self Outcome: Progressing   

## 2023-05-20 NOTE — Progress Notes (Signed)
D- Patient alert and oriented x 4. Affect  anxious/mood congruent. Denies SI/ HI/ AVH. She denies pain. Patient endorses minimal depression and anxiety. She states her goal of today is to attend groups. She states I am not sure what I need to work on, "I know I have issues to resolve, not sure how to though". Left inner arm (3) cut wounds cleansed w/ NS then Iodoform guaze applied, wrapped with Kerlix and covered w/ Coban. All 3 wounds approximately 2"L x 1/4" w. Tissue bed with yellow slough and pink granulating surrounding tissue. A- Scheduled medications administered to patient, per MD orders. She began Paxil today and was educated on actions, dose and adverse reactions Support and encouragement provided.  Routine safety checks conducted every 15 minutes without incident.  Patient informed to notify staff with problems or concerns and verbalizes understanding. R- No adverse drug reactions noted.  Patient compliant with medications and treatment plan. Patient receptive, calm cooperative and interacts well with others on the unit.  Patient contracts for safety and  remains safe on the unit at this time.

## 2023-05-20 NOTE — Progress Notes (Signed)
[facility] MD Progress Note  05/20/2023 10:42 AM Nancy Matthews  MRN:  [id number] Subjective:  Patient stated that her mood is better today but she still have social anxiety. Patient visible in the milieu. Appropriate with staff & peers.Denies SI,HI and AVH. Dressing changed on the left wrist wound.Wound looks red with no discharge. Appetite and energy level good.   Patient has been calm, is compliant with her medicines and slept well last night. She presents now in a mild to moderately depressed mood, has some anxiety and is pleasant during assessment. She reports that a lot of stressors piled up and she had an episode with dissociation when she had made these cuts. She is feeling anxious, is denying active SI at this time and is denying any HI or AVH. She has tried Wellbutrin in the past for depression but it was not helpful. She is willing to try an SSRI for depression and is agreeable to Paxil. She reports taking Metformin at home and will like to get back on it.  Principal Problem: Schizoaffective disorder, bipolar type (HCC) Diagnosis: Principal Problem:   Schizoaffective disorder, bipolar type (HCC) Active Problems:   Self-inflicted laceration of left wrist (HCC)   Borderline personality disorder (HCC)  Total Time spent with patient: 20 minutes  Past Psychiatric History: Schizoaffective disorder, bipolar type (HCC) Active Problems:   Self-inflicted laceration of left wrist (HCC)   Borderline personality disorder (HCC)  Past Medical History:  Past Medical History:  Diagnosis Date   Anxiety    Asthma    Chronic post-traumatic stress disorder (PTSD)    Depression    Obesity    Panic attacks    Personality disorder (HCC)    Schizophrenia (HCC)    History reviewed. No pertinent surgical history. Family History: History reviewed. No pertinent family history. Family Psychiatric  History:  Social History:  Social History   Substance and Sexual Activity  Alcohol Use Not Currently      Social History   Substance and Sexual Activity  Drug Use Yes   Types: Marijuana   Comment: CBD    Social History   Socioeconomic History   Marital status: Single    Spouse name: Not on file   Number of children: Not on file   Years of education: Not on file   Highest education level: Not on file  Occupational History   Not on file  Tobacco Use   Smoking status: Never   Smokeless tobacco: Never  Vaping Use   Vaping Use: Never used  Substance and Sexual Activity   Alcohol use: Not Currently   Drug use: Yes    Types: Marijuana    Comment: CBD   Sexual activity: Yes  Other Topics Concern   Not on file  Social History Narrative   Not on file   Social Determinants of Health   Financial Resource Strain: Not on file  Food Insecurity: No Food Insecurity (05/19/2023)   Hunger Vital Sign    Worried About Running Out of Food in the Last Year: Never true    Ran Out of Food in the Last Year: Never true  Transportation Needs: No Transportation Needs (05/19/2023)   PRAPARE - Administrator, Civil Service (Medical): No    Lack of Transportation (Non-Medical): No  Physical Activity: Not on file  Stress: Not on file  Social Connections: Not on file   Additional Social History:  Sleep: Good  Appetite:  Good  Current Medications: Current Facility-Administered Medications  Medication Dose Route Frequency Provider Last Rate Last Admin   acetaminophen (TYLENOL) tablet 650 mg  650 mg Oral Q6H PRN Onuoha, Chinwendu V, NP       alum & mag hydroxide-simeth (MAALOX/MYLANTA) 200-200-20 MG/5ML suspension 30 mL  30 mL Oral Q4H PRN Onuoha, Chinwendu V, NP       ARIPiprazole (ABILIFY) tablet 15 mg  15 mg Oral QHS Clapacs, John T, MD   15 mg at 05/19/23 2208   diphenhydrAMINE (BENADRYL) capsule 50 mg  50 mg Oral TID PRN Onuoha, Chinwendu V, NP       Or   diphenhydrAMINE (BENADRYL) injection 50 mg  50 mg Intramuscular TID PRN Onuoha, Chinwendu V,  NP       haloperidol (HALDOL) tablet 5 mg  5 mg Oral TID PRN Onuoha, Chinwendu V, NP       Or   haloperidol lactate (HALDOL) injection 5 mg  5 mg Intramuscular TID PRN Onuoha, Chinwendu V, NP       hydrOXYzine (ATARAX) tablet 25 mg  25 mg Oral TID PRN Onuoha, Chinwendu V, NP       LORazepam (ATIVAN) tablet 2 mg  2 mg Oral TID PRN Onuoha, Chinwendu V, NP       Or   LORazepam (ATIVAN) injection 2 mg  2 mg Intramuscular TID PRN Onuoha, Chinwendu V, NP       magnesium hydroxide (MILK OF MAGNESIA) suspension 30 mL  30 mL Oral Daily PRN Onuoha, Chinwendu V, NP       metFORMIN (GLUCOPHAGE-XR) 24 hr tablet 750 mg  750 mg Oral BID WC Donnetta Gillin       PARoxetine (PAXIL) tablet 10 mg  10 mg Oral Daily Leily Capek       traZODone (DESYREL) tablet 50 mg  50 mg Oral QHS PRN Onuoha, Chinwendu V, NP   50 mg at 05/19/23 2208    Lab Results:  Results for orders placed or performed during the hospital encounter of 05/18/23 (from the past 48 hour(s))  Comprehensive metabolic panel     Status: None   Collection Time: 05/18/23  3:55 PM  Result Value Ref Range   Sodium 140 135 - 145 mmol/L   Potassium 3.9 3.5 - 5.1 mmol/L   Chloride 107 98 - 111 mmol/L   CO2 23 22 - 32 mmol/L   Glucose, Bld 91 70 - 99 mg/dL    Comment: Glucose reference range applies only to samples taken after fasting for at least 8 hours.   BUN 11 6 - 20 mg/dL   Creatinine, Ser 9.56 0.44 - 1.00 mg/dL   Calcium 9.0 8.9 - 21.3 mg/dL   Total Protein 7.5 6.5 - 8.1 g/dL   Albumin 4.0 3.5 - 5.0 g/dL   AST 29 15 - 41 U/L   ALT 36 0 - 44 U/L   Alkaline Phosphatase 64 38 - 126 U/L   Total Bilirubin 0.7 0.3 - 1.2 mg/dL   GFR, Estimated >08 >65 mL/min    Comment: (NOTE) Calculated using the CKD-EPI Creatinine Equation (2021)    Anion gap 10 5 - 15    Comment: Performed at [facility], [address] [place], [place] [address]  Ethanol     Status: None   Collection Time: 05/18/23  3:55 PM  Result Value Ref Range   Alcohol,  Ethyl (B) <10 <10 mg/dL    Comment: (NOTE) Lowest detectable  limit for serum alcohol is 10 mg/dL.  For medical purposes only. Performed at [facility], [address] [place], [place] [address]   Salicylate level     Status: Abnormal   Collection Time: 05/18/23  3:55 PM  Result Value Ref Range   Salicylate Lvl <7.0 (L) 7.0 - 30.0 mg/dL    Comment: Performed at [facility], [address] [place], [place] [address]  Acetaminophen level     Status: Abnormal   Collection Time: 05/18/23  3:55 PM  Result Value Ref Range   Acetaminophen (Tylenol), Serum <10 (L) 10 - 30 ug/mL    Comment: (NOTE) Therapeutic concentrations vary significantly. A range of 10-30 ug/mL  may be an effective concentration for many patients. However, some  are best treated at concentrations outside of this range. Acetaminophen concentrations >150 ug/mL at 4 hours after ingestion  and >50 ug/mL at 12 hours after ingestion are often associated with  toxic reactions.  Performed at [facility], [address] [place], [place] [address]   cbc     Status: None   Collection Time: 05/18/23  3:55 PM  Result Value Ref Range   WBC 6.3 4.0 - 10.5 K/uL   RBC 4.49 3.87 - 5.11 MIL/uL   Hemoglobin 12.7 12.0 - 15.0 g/dL   HCT 91.4 78.2 - 95.6 %   MCV 86.4 80.0 - 100.0 fL   MCH 28.3 26.0 - 34.0 pg   MCHC 32.7 30.0 - 36.0 g/dL   RDW 21.3 08.6 - 57.8 %   Platelets 286 150 - 400 K/uL   nRBC 0.0 0.0 - 0.2 %    Comment: Performed at [facility], [address], [place], [place] [address]  Urine Drug Screen, Qualitative     Status: Abnormal   Collection Time: 05/18/23  8:05 PM  Result Value Ref Range   Tricyclic, Ur Screen NONE DETECTED NONE DETECTED   Amphetamines, Ur Screen NONE DETECTED NONE DETECTED   MDMA (Ecstasy)Ur Screen NONE DETECTED NONE DETECTED   Cocaine Metabolite,Ur [place] NONE DETECTED NONE DETECTED   Opiate, Ur Screen NONE DETECTED NONE DETECTED   Phencyclidine  (PCP) Ur S NONE DETECTED NONE DETECTED   Cannabinoid 50 Ng, Ur [place] POSITIVE (A) NONE DETECTED   Barbiturates, Ur Screen NONE DETECTED NONE DETECTED   Benzodiazepine, Ur Scrn NONE DETECTED NONE DETECTED   Methadone Scn, Ur NONE DETECTED NONE DETECTED    Comment: (NOTE) Tricyclics + metabolites, urine    Cutoff 1000 ng/mL Amphetamines + metabolites, urine  Cutoff 1000 ng/mL MDMA (Ecstasy), urine              Cutoff 500 ng/mL Cocaine Metabolite, urine          Cutoff 300 ng/mL Opiate + metabolites, urine        Cutoff 300 ng/mL Phencyclidine (PCP), urine         Cutoff 25 ng/mL Cannabinoid, urine                 Cutoff 50 ng/mL Barbiturates + metabolites, urine  Cutoff 200 ng/mL Benzodiazepine, urine              Cutoff 200 ng/mL Methadone, urine                   Cutoff 300 ng/mL  The urine drug screen provides only a preliminary, unconfirmed analytical test result and should not be used for non-medical purposes. Clinical consideration and professional judgment should be applied to  any positive drug screen result due to possible interfering substances. A more specific alternate chemical method must be used in order to obtain a confirmed analytical result. Gas chromatography / mass spectrometry (GC/MS) is the preferred confirm atory method. Performed at [facility], [address] [place], [place] [address]   Pregnancy, urine     Status: None   Collection Time: 05/18/23  8:05 PM  Result Value Ref Range   Preg Test, Ur NEGATIVE NEGATIVE    Comment: Performed at [facility], [address] [place], [place] [address]    Blood Alcohol level:  Lab Results  Component Value Date   [facility] ([facility]) <10 05/18/2023    Metabolic Disorder Labs: No results found for: "HGBA1C", "MPG" No results found for: "PROLACTIN" No results found for: "CHOL", "TRIG", "HDL", "CHOLHDL", "VLDL", "LDLCALC"  Physical Findings: AIMS:  , ,  ,  ,    CIWA:    COWS:      Musculoskeletal: Strength & Muscle Tone: within normal limits Gait & Station: normal Patient leans: N/A  Psychiatric Specialty Exam:  Presentation  General Appearance:  Disheveled  Eye Contact: Fair  Speech: Clear and Coherent  Speech Volume: Normal  Handedness: Right   Mood and Affect  Mood: Depressed  Affect: Congruent   Thought Process  Thought Processes: Coherent  Descriptions of Associations:Intact  Orientation:Full (Time, Place and Person)  Thought Content:WDL  History of Schizophrenia/Schizoaffective disorder:No  Duration of Psychotic Symptoms:No data recorded Hallucinations:No data recorded Ideas of Reference:None  Suicidal Thoughts:No data recorded Homicidal Thoughts:No data recorded  Sensorium  Memory: Immediate Fair  Judgment: Poor  Insight: Shallow   Executive Functions  Concentration: Good  Attention Span: Good  Recall: Good  Fund of Knowledge: Good  Language: Good   Psychomotor Activity  Psychomotor Activity:No data recorded  Assets  Assets: Communication Skills; Housing   Sleep  Sleep:No data recorded   Physical Exam: Physical Exam Constitutional:      Appearance: Normal appearance.  HENT:     Head: Normocephalic and atraumatic.     Right Ear: External ear normal.     Left Ear: External ear normal.     Nose: Nose normal.     Mouth/Throat:     Mouth: Mucous membranes are moist.  Eyes:     Extraocular Movements: Extraocular movements intact.     Pupils: Pupils are equal, round, and reactive to light.  Cardiovascular:     Rate and Rhythm: Normal rate and regular rhythm.     Pulses: Normal pulses.     Heart sounds: Normal heart sounds.  Pulmonary:     Effort: Pulmonary effort is normal.     Breath sounds: Normal breath sounds.  Abdominal:     General: Abdomen is flat. Bowel sounds are normal.     Palpations: Abdomen is soft.  Musculoskeletal:        General: Normal range of motion.      Cervical back: Normal range of motion and neck supple.  Skin:    General: Skin is warm.  Neurological:     General: No focal deficit present.     Mental Status: She is alert.    Review of Systems  Constitutional: Negative.   HENT: Negative.    Eyes: Negative.   Respiratory: Negative.    Cardiovascular: Negative.   Gastrointestinal: Negative.   Genitourinary: Negative.   Musculoskeletal: Negative.   Skin: Negative.   Neurological: Negative.   Endo/Heme/Allergies: Negative.   Psychiatric/Behavioral:  Positive for depression and  suicidal ideas. The patient is nervous/anxious.    Blood pressure 122/65, pulse 80, temperature 97.7 F (36.5 C), temperature source Oral, resp. rate 18, height 5\' 5"  (1.651 m), weight 116 kg, last menstrual period 05/13/2023, SpO2 99 %. Body mass index is 42.56 kg/m.   Treatment Plan Summary: Daily contact with patient to assess and evaluate symptoms and progress in treatment and Medication management; Patient will be continued on current dose of Abilify today. We will add low dose Paxil for depression and anxiety.   Leo Rod 05/20/2023, 10:42 AM

## 2023-05-20 NOTE — Group Note (Signed)
LCSW Group Therapy Note   Group Date: 05/20/2023 Start Time: 1305 End Time: 1335   Type of Therapy and Topic:  Group Therapy: Self-Care VS. Coping Skills  Participation Level:  Active     Summary of Patient Progress:  The patient attended group. The patient answered the icebreaker question, stating that the 1st thing she thought of when she woke up this morning was about her boyfriend. The patient stated that she uses bubble baths, face wash,and moisturizing as coping skills. The patient states she shuts down and would like better ways to cope.      Marshell Levan, LCSWA 05/20/2023  3:08 PM

## 2023-05-20 NOTE — Group Note (Signed)
Date:  05/20/2023 Time:  3:51 PM  Group Topic/Focus:  Goals Group:   The focus of this group is to help patients establish daily goals to achieve during treatment and discuss how the patient can incorporate goal setting into their daily lives to aide in recovery.    Participation Level:  Did Not Attend   Lynelle Smoke [facility] 05/20/2023, 3:51 PM

## 2023-05-21 MED ORDER — ARIPIPRAZOLE 15 MG PO TABS: 15.0000 mg | ORAL_TABLET | Freq: Every day | ORAL | 1 refills | Status: AC

## 2023-05-21 MED ORDER — TRAZODONE HCL 50 MG PO TABS: 50.0000 mg | ORAL_TABLET | Freq: Every evening | ORAL | 1 refills | Status: AC | PRN

## 2023-05-21 MED ORDER — PAROXETINE HCL 10 MG PO TABS: 10.0000 mg | ORAL_TABLET | Freq: Every day | ORAL | 1 refills | Status: AC
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
## 2023-05-21 NOTE — [facility] Suicide Risk Assessment (Signed)
[facility] Discharge Suicide Risk Assessment   Principal Problem: Schizoaffective disorder, bipolar type [facility]) Discharge Diagnoses: Principal Problem:   Schizoaffective disorder, bipolar type (HCC) Active Problems:   Self-inflicted laceration of left wrist (HCC)   Borderline personality disorder (HCC)   Total Time spent with patient: 20 minutes  Musculoskeletal: Strength & Muscle Tone: within normal limits Gait & Station: normal Patient leans: N/A  Psychiatric Specialty Exam  Presentation  General Appearance:  Appropriate for Environment; Casual; Neat  Eye Contact: Fair  Speech: Clear and Coherent  Speech Volume: Normal  Handedness: Right   Mood and Affect  Mood: " Much Better"   Duration of Depression Symptoms: Greater than two weeks  Affect: Appropriate; Full Range   Thought Process  Thought Processes: Coherent; Goal Directed  Descriptions of Associations:Intact  Orientation:Full (Time, Place and Person)  Thought Content:Logical  History of Schizophrenia/Schizoaffective disorder:Yes  Duration of Psychotic Symptoms:No data recorded Hallucinations:Hallucinations: None  Ideas of Reference:None  Suicidal Thoughts: Denies Homicidal Thoughts:Homicidal Thoughts: No   Sensorium  Memory: Immediate Fair; Remote Good  Judgment: fair  Insight: Fair   Art therapist  Concentration: Good  Attention Span: Fair  Recall: [employer] of Knowledge: Fair  Language: Fair   Psychomotor Activity  Psychomotor Activity: Psychomotor Activity: Normal   Assets  Assets: Communication Skills; Desire for Improvement; Financial Resources/Insurance; Housing; Intimacy; Social Support   Sleep  Sleep: Sleep: Fair   Physical Exam: Physical Exam ROS Blood pressure 110/63, pulse 89, temperature 98.2 F (36.8 C), temperature source Oral, resp. rate 19, height 5\' 5"  (1.651 m), weight 116 kg, last menstrual period 05/13/2023, SpO2 98 %. Body  mass index is 42.56 kg/m.  Mental Status Per Nursing Assessment::   On Admission:  Self-harm behaviors  Demographic Factors:  Caucasian  Loss Factors: NA  Historical Factors: Prior suicide attempts, Family history of mental illness or substance abuse, and Impulsivity  Risk Reduction Factors:   Living with another person, especially a relative, Positive social support, Positive therapeutic relationship, and Positive coping skills or problem solving skills  Continued Clinical Symptoms:  Bipolar Disorder:   Depressive phase  Cognitive Features That Contribute To Risk:  None    Suicide Risk:  Minimal: No identifiable suicidal ideation.  Patients presenting with no risk factors but with morbid ruminations; may be classified as minimal risk based on the severity of the depressive symptoms   Follow-up Information     [facility] Follow up.   Why: Your appointment is scheduled for Friday, 05/26/23 at 11am. Thanks! Contact information: [address] [place] [address] [phone]                 Plan Of Care/Follow-up recommendations:  Other:  " Outpatient "  Leo Rod 05/21/2023, 9:17 AM

## 2023-05-21 NOTE — Plan of Care (Signed)
  Problem: Education: Goal: Knowledge of General Education information will improve Description: Including pain rating scale, medication(s)/side effects and non-pharmacologic comfort measures Outcome: Adequate for Discharge   Problem: Health Behavior/Discharge Planning: Goal: Ability to manage health-related needs will improve Outcome: Adequate for Discharge   Problem: Clinical Measurements: Goal: Ability to maintain clinical measurements within normal limits will improve Outcome: Adequate for Discharge Goal: Will remain free from infection Outcome: Adequate for Discharge Goal: Diagnostic test results will improve Outcome: Adequate for Discharge Goal: Respiratory complications will improve Outcome: Adequate for Discharge Goal: Cardiovascular complication will be avoided Outcome: Adequate for Discharge   Problem: Activity: Goal: Risk for activity intolerance will decrease Outcome: Adequate for Discharge   Problem: Nutrition: Goal: Adequate nutrition will be maintained Outcome: Adequate for Discharge   Problem: Coping: Goal: Level of anxiety will decrease Outcome: Adequate for Discharge   Problem: Elimination: Goal: Will not experience complications related to bowel motility Outcome: Adequate for Discharge Goal: Will not experience complications related to urinary retention Outcome: Adequate for Discharge   Problem: Pain Managment: Goal: General experience of comfort will improve Outcome: Adequate for Discharge   Problem: Skin Integrity: Goal: Risk for impaired skin integrity will decrease Outcome: Adequate for Discharge   Problem: Activity: Goal: Will identify at least one activity in which they can participate Outcome: Adequate for Discharge   Problem: Coping: Goal: Ability to identify and develop effective coping behavior will improve Outcome: Adequate for Discharge Goal: Ability to interact with others will improve Outcome: Adequate for Discharge Goal:  Demonstration of participation in decision-making regarding own care will improve Outcome: Adequate for Discharge Goal: Ability to use eye contact when communicating with others will improve Outcome: Adequate for Discharge

## 2023-05-21 NOTE — Progress Notes (Signed)
Patient was provided with discharge summary, Transition packet, and Suicide Risk Assessment. Verbalized discharge summary, transition packet and suicide risk assessment, patient made aware of any change in medications, and all upcoming appointments.   Patient belongings returned. Rx provided to patient.  Patient denied any SI, denies plans for self harm or the harm of others. Patient is calm, with appropriate affect and eye contact. Patient reports no complaints at this time.  
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
## 2023-05-22 NOTE — [facility] Suicide Risk Assessment (Signed)
[facility] INPATIENT:  Family/Significant Other Suicide Prevention Education  Suicide Prevention Education:  Patient Refusal for Family/Significant Other Suicide Prevention Education: The patient Nancy Matthews has refused to provide written consent for family/significant other to be provided Family/Significant Other Suicide Prevention Education during admission and/or prior to discharge.  Physician notified.  SPE completed with pt, as pt refused to consent to family contact. SPI pamphlet provided to pt and pt was encouraged to share information with support network, ask questions, and talk about any concerns relating to SPE. Pt denies access to guns/firearms and verbalized understanding of information provided. Mobile Crisis information also provided to pt.  Glenis Smoker 05/22/2023, 3:51 PM

## 2023-05-22 NOTE — Progress Notes (Signed)
  [facility] Adult Case Management Discharge Plan :  Will you be returning to the same living situation after discharge:  Yes,  pt returned home upon discharge. At discharge, do you have transportation home?: Yes,  pt support system provide transportation. Do you have the ability to pay for your medications: Yes,  [employer].  Release of information consent forms completed and in the chart;  Patient's signature needed at discharge.  Patient to Follow up at:  Follow-up Information     [facility] Follow up.   Why: Your appointment is scheduled for Friday, 05/26/23 at 11am. Thanks! Contact information: [address] [place] [address] [phone]                 Next level of care provider has access to [facility] Link:no  Safety Planning and Suicide Prevention discussed: No.     Has patient been referred to the Quitline?: Patient does not use tobacco/nicotine products  Patient has been referred for addiction treatment: Patient refused referral for treatment.  Glenis Smoker, LCSW 05/22/2023, 3:52 PM

## 2023-06-06 NOTE — ED Provider Notes (Signed)
   [facility] Provider Note    Event Date/Time   First MD Initiated Contact with Patient 05/18/23 1808     (approximate)  History   Chief Complaint: Suicidal  HPI  Nancy Matthews is a 27 y.o. female with past medical history anxiety, depression, schizophrenia, presents to the emergency department with approximately three 3 to 4 cm lacerations to her left wrist.  Patient states she did this intentionally to hurt herself.  Patient was just seen earlier in the day in the emergency department for another self-inflicted wound ultimately discharged home.  At this time we will place patient under an IVC and have psychiatry TTS evaluate.  Patient denies any other concerns.  Physical Exam   Triage Vital Signs: ED Triage Vitals  Enc Vitals Group     BP 05/18/23 1555 124/84     Pulse Rate 05/18/23 1555 86     Resp 05/18/23 1555 18     Temp 05/18/23 1555 98.9 F (37.2 C)     Temp Source 05/18/23 1555 Oral     SpO2 05/18/23 1555 96 %     Weight 05/18/23 1553 255 lb (115.7 kg)     Height 05/18/23 1553 5\' 5"  (1.651 m)     Head Circumference --      Peak Flow --      Pain Score 05/18/23 1552 4     Pain Loc --      Pain Edu? --      Excl. in GC? --     Most recent vital signs: Vitals:   05/18/23 1948 05/19/23 0223  BP: (!) 105/53 121/60  Pulse: 75 81  Resp: 18 17  Temp: 98.2 F (36.8 C) 98.3 F (36.8 C)  SpO2: 98% 99%    General: Awake, no distress.  Admits self-mutilation CV:  Good peripheral perfusion.   Resp:  Normal effort.  Abd:  No distention.  Other:  Patient has three 3 to 4 cm superficial lacerations to left wrist.  None of which appear to require repair.   ED Results / Procedures / Treatments   MEDICATIONS ORDERED IN ED: Medications - No data to display   IMPRESSION / MDM / ASSESSMENT AND PLAN / ED COURSE  I reviewed the triage vital signs and the nursing notes.  Patient's presentation is most consistent with acute presentation with  potential threat to life or bodily function.  Patient presents emergency department after 3 self-inflicted wounds to the left wrist.  Patient is lab work reassuring including normal CBC, reassuring chemistry, cannabinoid positive urine toxicology otherwise no concerning findings, pregnancy test is negative.  Given the patient's recurrent self-inflicted wounds I placed the patient under an IVC and have psychiatry TTS evaluate.  No other concerning findings on my evaluation/exam.  FINAL CLINICAL IMPRESSION(S) / ED DIAGNOSES   Self-mutilation   Note:  This document was prepared using Dragon voice recognition software and may include unintentional dictation errors.   Minna Antis, MD 06/06/23 [phone]

## 2023-06-22 DIAGNOSIS — T1491XA Suicide attempt, initial encounter: Secondary | ICD-10-CM
